# Patient Record
Sex: Male | Born: 1950
Health system: Southern US, Community
[De-identification: ages and names within clinical notes are randomized; demographics above are authoritative.]

## PROBLEM LIST (undated history)

## (undated) DIAGNOSIS — G309 Alzheimer's disease, unspecified: Secondary | ICD-10-CM

## (undated) DIAGNOSIS — R05 Cough: Secondary | ICD-10-CM

## (undated) DIAGNOSIS — I10 Essential (primary) hypertension: Secondary | ICD-10-CM

## (undated) DIAGNOSIS — R053 Chronic cough: Secondary | ICD-10-CM

## (undated) DIAGNOSIS — R413 Other amnesia: Secondary | ICD-10-CM

## (undated) DIAGNOSIS — R7303 Prediabetes: Secondary | ICD-10-CM

## (undated) DIAGNOSIS — F0280 Dementia in other diseases classified elsewhere without behavioral disturbance: Secondary | ICD-10-CM

## (undated) HISTORY — DX: Chronic cough: R05.3

## (undated) HISTORY — DX: Dementia in other diseases classified elsewhere without behavioral disturbance: F02.80

## (undated) HISTORY — DX: Prediabetes: R73.03

## (undated) HISTORY — DX: Other amnesia: R41.3

## (undated) HISTORY — DX: Cough: R05

## (undated) HISTORY — DX: Essential (primary) hypertension: I10

## (undated) HISTORY — DX: Alzheimer's disease, unspecified: G30.9

---

## 2014-05-23 HISTORY — PX: BUNIONECTOMY: SHX129

## 2014-08-28 ENCOUNTER — Ambulatory Visit (INDEPENDENT_AMBULATORY_CARE_PROVIDER_SITE_OTHER): Payer: No Typology Code available for payment source | Admitting: Podiatry

## 2014-08-28 ENCOUNTER — Ambulatory Visit (INDEPENDENT_AMBULATORY_CARE_PROVIDER_SITE_OTHER): Payer: No Typology Code available for payment source

## 2014-08-28 DIAGNOSIS — M79671 Pain in right foot: Secondary | ICD-10-CM

## 2014-08-28 DIAGNOSIS — M2011 Hallux valgus (acquired), right foot: Secondary | ICD-10-CM

## 2014-08-28 DIAGNOSIS — M79672 Pain in left foot: Secondary | ICD-10-CM

## 2014-08-28 DIAGNOSIS — M21611 Bunion of right foot: Secondary | ICD-10-CM

## 2014-08-28 NOTE — Progress Notes (Signed)
   Subjective:    Patient ID: Edwin Stewart, male    DOB: [birth year], 64 y.o.   MRN: [id number]  HPI Pt presents with bilateral bunions, worse on right foot and painful   Review of Systems  Respiratory: Positive for shortness of breath.   Neurological: Positive for dizziness.  All other systems reviewed and are negative.      Objective:   Physical Exam        Assessment & Plan:

## 2014-08-28 NOTE — Patient Instructions (Signed)
Pre-Operative Instructions  Congratulations, you have decided to take an important step to improving your quality of life.  You can be assured that the doctors of [facility] will be with you every step of the way.  1. Plan to be at the surgery center/hospital at least 1 (one) hour prior to your scheduled time unless otherwise directed by the surgical center/hospital staff.  You must have a responsible adult accompany you, remain during the surgery and drive you home.  Make sure you have directions to the surgical center/hospital and know how to get there on time. 2. For hospital based surgery you will need to obtain a history and physical form from your family physician within 1 month prior to the date of surgery- we will give you a form for you primary physician.  3. We make every effort to accommodate the date you request for surgery.  There are however, times where surgery dates or times have to be moved.  We will contact you as soon as possible if a change in schedule is required.   4. No Aspirin/Ibuprofen for one week before surgery.  If you are on aspirin, any non-steroidal anti-inflammatory medications (Mobic, Aleve, Ibuprofen) you should stop taking it 7 days prior to your surgery.  You make take Tylenol  For pain prior to surgery.  5. Medications- If you are taking daily heart and blood pressure medications, seizure, reflux, allergy, asthma, anxiety, pain or diabetes medications, make sure the surgery center/hospital is aware before the day of surgery so they may notify you which medications to take or avoid the day of surgery. 6. No food or drink after midnight the night before surgery unless directed otherwise by surgical center/hospital staff. 7. No alcoholic beverages 24 hours prior to surgery.  No smoking 24 hours prior to or 24 hours after surgery. 8. Wear loose pants or shorts- loose enough to fit over bandages, boots, and casts. 9. No slip on shoes, sneakers are best. 10. Bring  your boot with you to the surgery center/hospital.  Also bring crutches or a walker if your physician has prescribed it for you.  If you do not have this equipment, it will be provided for you after surgery. 11. If you have not been contracted by the surgery center/hospital by the day before your surgery, call to confirm the date and time of your surgery. 12. Leave-time from work may vary depending on the type of surgery you have.  Appropriate arrangements should be made prior to surgery with your employer. 13. Prescriptions will be provided immediately following surgery by your doctor.  Have these filled as soon as possible after surgery and take the medication as directed. 14. Remove nail polish on the operative foot. 15. Wash the night before surgery.  The night before surgery wash the foot and leg well with the antibacterial soap provided and water paying special attention to beneath the toenails and in between the toes.  Rinse thoroughly with water and dry well with a towel.  Perform this wash unless told not to do so by your physician.  Enclosed: 1 Ice pack (please put in freezer the night before surgery)   1 Hibiclens skin cleaner   Pre-op Instructions  If you have any questions regarding the instructions, do not hesitate to call our office.  [place]: [address] [place], [place] [address] [phone]  [place]: [address] [place], [place] [address] [phone]  [place]: [address]  [place],  [address] [phone]  Dr. Richard   Tuchman DPM, Dr. Norman Regal DPM Dr. Richard Sikora DPM, Dr. M. Todd Hyatt DPM, Dr. Kathryn Egerton DPM 

## 2014-08-29 NOTE — Progress Notes (Signed)
Subjective:     Patient ID: Edwin Stewart, male   DOB: [birth year], 64 y.o.   MRN: [id number]  HPI patient presents with painful bunion deformity right over left foot that red and makes it difficult to wear shoe gear. Patient states she's tried shoe gear modifications and soaks without relief   Review of Systems  All other systems reviewed and are negative.      Objective:   Physical Exam  Constitutional: She is oriented to person, place, and time.  Cardiovascular: Intact distal pulses.   Musculoskeletal: Normal range of motion.  Neurological: She is oriented to person, place, and time.  Skin: Skin is warm.  Nursing note and vitals reviewed.  neurovascular status found to be intact with muscle strength adequate and range of motion subtalar midtarsal joint within normal limits. Patient's noted to have redness and pain around the first metatarsal head right with mild deviation of the hallux against second toe and is noted to have moderate depression of the arch bilateral     Assessment:     Structural HAV deformity right over left foot with pain and deformity and moderate flatfoot deformity which is a part of the symptomatically process    Plan:     Reviewed condition at great length and I've recommended correction of deformity secondary to pain and pressure and inability to wear shoe gear with any degree of comfort. Patient wants to get it fixed and needs to get it fixed as soon as possible due to his son getting married in July and at this time I did allow him to read a consent form for Austin-type osteotomy right with pin fixation. I explained alternative treatments and I explained complications and reviewed all possible complications that can occur. Patient wants the surgery understanding total recovery. Take approximate 6 months to one year and at this time signs consent form. He is dispensed air fracture walker with all instructions on usage and will be seen back for surgical  intervention and is encouraged to call with any questions prior to surgery in the next several weeks

## 2014-09-09 ENCOUNTER — Encounter: Payer: Self-pay | Admitting: Podiatry

## 2014-09-09 DIAGNOSIS — M2011 Hallux valgus (acquired), right foot: Secondary | ICD-10-CM | POA: Diagnosis not present

## 2014-09-11 NOTE — Progress Notes (Signed)
DOS 09/09/2014 Right austin bunionectomy

## 2014-09-15 ENCOUNTER — Ambulatory Visit (INDEPENDENT_AMBULATORY_CARE_PROVIDER_SITE_OTHER): Payer: No Typology Code available for payment source

## 2014-09-15 ENCOUNTER — Ambulatory Visit (INDEPENDENT_AMBULATORY_CARE_PROVIDER_SITE_OTHER): Payer: No Typology Code available for payment source | Admitting: Podiatry

## 2014-09-15 DIAGNOSIS — Z9889 Other specified postprocedural states: Secondary | ICD-10-CM

## 2014-09-15 DIAGNOSIS — M2011 Hallux valgus (acquired), right foot: Secondary | ICD-10-CM

## 2014-09-15 DIAGNOSIS — M21611 Bunion of right foot: Secondary | ICD-10-CM

## 2014-09-16 NOTE — Progress Notes (Signed)
Subjective:     Patient ID: Edwin Stewart, male   DOB: [birth year], 64 y.o.   MRN: [id number]  HPI patient states I'm doing real well with my foot and having good motion and minimal discomfort or swelling   Review of Systems     Objective:   Physical Exam Neurovascular status intact muscle strength adequate with good first MPJ motion 25 dorsi flexion 20 plantar flexion with no pain no crepitus noted and wound edges that are well coapted    Assessment:     Doing well 1 week after foot surgery right first MPJ    Plan:     X-rays reviewed with patient and BioSkin type above ankle brace dispensed with attachment to plantarflexed the hallux for physical therapy. Patient will begin exercises and gradually migrated surgical shoe and will continue elevation and compression

## 2014-09-29 ENCOUNTER — Ambulatory Visit: Payer: No Typology Code available for payment source | Admitting: *Deleted

## 2014-09-29 VITALS — BP 178/100 | HR 83 | Resp 14

## 2014-09-29 DIAGNOSIS — Z9889 Other specified postprocedural states: Secondary | ICD-10-CM

## 2014-09-29 NOTE — Progress Notes (Signed)
   Subjective:    Patient ID: Edwin Stewart, male    DOB: 06-Nov-1950, 64 y.o.   MRN: [id number]  HPI Comments: DOS 09/09/2014 right austin bunionectomy.  Pt states he has a bunion brace that he uses and I encouraged pt to continue and return in 2 weeks for follow up.     Review of Systems     Objective:   Physical Exam        Assessment & Plan:

## 2014-10-27 ENCOUNTER — Ambulatory Visit (INDEPENDENT_AMBULATORY_CARE_PROVIDER_SITE_OTHER): Payer: No Typology Code available for payment source

## 2014-10-27 ENCOUNTER — Ambulatory Visit (INDEPENDENT_AMBULATORY_CARE_PROVIDER_SITE_OTHER): Payer: No Typology Code available for payment source | Admitting: Podiatry

## 2014-10-27 VITALS — BP 135/73 | HR 77 | Resp 15

## 2014-10-27 DIAGNOSIS — Z9889 Other specified postprocedural states: Secondary | ICD-10-CM

## 2014-10-27 DIAGNOSIS — M2011 Hallux valgus (acquired), right foot: Secondary | ICD-10-CM

## 2014-10-27 DIAGNOSIS — M21611 Bunion of right foot: Secondary | ICD-10-CM

## 2014-10-27 NOTE — Progress Notes (Signed)
Subjective:     Patient ID: Edwin Stewart, male   DOB: 27-Feb-1951, 64 y.o.   MRN: [id number]  HPI patient states I been doing very well and walking normally with still some swelling in my foot if him on it too long   Review of Systems     Objective:   Physical Exam  neurovascular status intact muscle strength adequate with well-healing surgical site right first metatarsal with wound edges well coapted and mild to moderate edema noted    Assessment:      doing well post Austin osteotomy right    Plan:      x-rays reviewed and at this time explained is been slight movement but it is stable and should not get worse and patient will be seen back as needed and at this time was allowed to resume normal activity but still avoid any type of intense activities on his right foot

## 2014-12-01 ENCOUNTER — Ambulatory Visit (INDEPENDENT_AMBULATORY_CARE_PROVIDER_SITE_OTHER): Payer: No Typology Code available for payment source | Admitting: Podiatry

## 2014-12-01 ENCOUNTER — Ambulatory Visit (INDEPENDENT_AMBULATORY_CARE_PROVIDER_SITE_OTHER): Payer: No Typology Code available for payment source

## 2014-12-01 DIAGNOSIS — Z9889 Other specified postprocedural states: Secondary | ICD-10-CM

## 2014-12-01 DIAGNOSIS — L600 Ingrowing nail: Secondary | ICD-10-CM

## 2014-12-02 NOTE — Progress Notes (Signed)
Subjective:     Patient ID: Edwin Stewart, male   DOB: February 14, 1951, 64 y.o.   MRN: [id number]  HPI patient states I'm doing well with my surgery right and I do have awful toenails 1 through 5 of both feet that I cannot cut and they tend to get infected and bleed when I try to cut them   Review of Systems     Objective:   Physical Exam Neurovascular status intact muscle strength adequate with well-healed surgical site first metatarsal right with damage nailbeds 2 through 5 bilateral with the hallux that had been removed in the past but I did note that there is a spicule on the right hallux. Patient is tired of trying to cut these and they are very incurvated when evaluated    Assessment:     Doing well post Austin osteotomy right with damage nailbeds 1 through 5 right 2 through 5 left    Plan:     Reviewed x-rays of right foot and both conditions. Patient wants nail removal and we will do it after his son is married and I discussed permanent nail procedures. Patient at this time is allowed to return to normal activities far as the bunion goes and any remaining swelling will gradually improve

## 2015-08-03 ENCOUNTER — Other Ambulatory Visit: Payer: Self-pay | Admitting: Allergy and Immunology

## 2015-08-03 ENCOUNTER — Ambulatory Visit
Admission: RE | Admit: 2015-08-03 | Discharge: 2015-08-03 | Disposition: A | Discharge status: Home / Self Care | Payer: No Typology Code available for payment source | Source: Ambulatory Visit | Attending: Allergy and Immunology | Admitting: Allergy and Immunology

## 2015-08-03 DIAGNOSIS — R05 Cough: Secondary | ICD-10-CM

## 2015-08-03 DIAGNOSIS — R059 Cough, unspecified: Secondary | ICD-10-CM

## 2015-08-04 ENCOUNTER — Other Ambulatory Visit (HOSPITAL_COMMUNITY): Payer: Self-pay

## 2015-08-10 ENCOUNTER — Telehealth (HOSPITAL_COMMUNITY): Payer: Self-pay | Admitting: *Deleted

## 2015-08-10 NOTE — Telephone Encounter (Signed)
Left message on voicemail in reference to upcoming appointment scheduled for 08/11/15. Phone number given for a call back so details instructions can be given. Kourosh Jablonsky W   

## 2015-08-11 ENCOUNTER — Other Ambulatory Visit (HOSPITAL_COMMUNITY): Payer: Self-pay

## 2015-08-11 ENCOUNTER — Other Ambulatory Visit (HOSPITAL_COMMUNITY): Payer: Self-pay | Admitting: Nurse Practitioner

## 2015-08-11 DIAGNOSIS — R42 Dizziness and giddiness: Secondary | ICD-10-CM

## 2015-08-25 ENCOUNTER — Ambulatory Visit (INDEPENDENT_AMBULATORY_CARE_PROVIDER_SITE_OTHER): Payer: No Typology Code available for payment source | Admitting: Internal Medicine

## 2015-08-25 ENCOUNTER — Other Ambulatory Visit (INDEPENDENT_AMBULATORY_CARE_PROVIDER_SITE_OTHER): Payer: No Typology Code available for payment source

## 2015-08-25 ENCOUNTER — Encounter: Payer: Self-pay | Admitting: Internal Medicine

## 2015-08-25 VITALS — BP 158/80 | HR 78 | Ht 71.0 in | Wt 230.2 lb

## 2015-08-25 DIAGNOSIS — R053 Chronic cough: Secondary | ICD-10-CM

## 2015-08-25 DIAGNOSIS — R599 Enlarged lymph nodes, unspecified: Secondary | ICD-10-CM

## 2015-08-25 DIAGNOSIS — R05 Cough: Secondary | ICD-10-CM

## 2015-08-25 DIAGNOSIS — R0689 Other abnormalities of breathing: Secondary | ICD-10-CM | POA: Diagnosis not present

## 2015-08-25 DIAGNOSIS — R0600 Dyspnea, unspecified: Secondary | ICD-10-CM

## 2015-08-25 DIAGNOSIS — R590 Localized enlarged lymph nodes: Secondary | ICD-10-CM

## 2015-08-25 LAB — BASIC METABOLIC PANEL
BUN: 16 mg/dL (ref 6–23)
CO2: 25 mEq/L (ref 19–32)
CREATININE: 0.94 mg/dL (ref 0.40–1.20)
Calcium: 10.2 mg/dL (ref 8.4–10.5)
Chloride: 102 mEq/L (ref 96–112)
GFR: 63.66 mL/min (ref >60.00)
Glucose, Bld: 133 mg/dL — ABNORMAL HIGH (ref 70–99)
POTASSIUM: 3.8 meq/L (ref 3.5–5.1)
Sodium: 137 mEq/L (ref 135–145)

## 2015-08-25 LAB — NITRIC OXIDE: NITRIC OXIDE: 18

## 2015-08-25 NOTE — Patient Instructions (Signed)
ICD-9-CM ICD-10-CM   1. Dyspnea and respiratory abnormality 786.09 R06.00     R06.89   2. Chronic cough 786.2 R05   3. Hilar adenopathy 785.6 R59.9    Symptoms remain unexplained  Plan  - Do CT chest with contrast in the next few weeks - Do full pulmonary function test in the next few weeks - Complete cardiac stress test scheduled by other physician  Follow-up - Return to see one of my nurse practitioner's after completing all of the above but do so in the next few weeks

## 2015-08-25 NOTE — Addendum Note (Signed)
Addended by: Collier Salina on: 08/25/2015 12:15 PM   Modules accepted: Orders

## 2015-08-25 NOTE — Progress Notes (Addendum)
Subjective:    Patient ID: Edwin Stewart, male    DOB: 1950-08-20, 65 y.o.   MRN: [id number]  PCP [facility]   HPI  IOV 08/25/2015  Chief Complaint  Patient presents with  . Pulmonary Consult    Pt referred by Dr. Fredderick Phenix for chronic cough. Pt states cough is occassional productive with white mucus x 3 months. Pt states after he has coughing spells then will develop gas. Pt c/o SOB with rest and activity.     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 Has been living and [place] retired for the last 11 years. He is accompanied by his wife Edwin Stewart. He is referred by Dr. Fredderick Phenix of allergy. At baseline. Patient has mild spring pollen allergies. At baseline he is also on ACE inhibitor's for many many years. Then starting around December 2016 started noticing insidious onset of cough and according to the wife even dyspnea on exertion. The wife provides more accurate history. Dyspnea is the bigger complaint. Dyspnea is noticed with exertion and relieved by rest. It is consistent repeatable. Dyspnea is noticed walking a flight of stairs or for example walking for the steps at a soccer game. A month ago was started on Symbicort and this has helped. I do not think complaints are related to lisinopril intake. In addition there is a cough also going on for the last 2-4 months. It is also improved with Symbicort. Is associated with ACE inhibitor intake but they do not think this is the causative agent. Cough is present nocturnally. It is associated with burping. Symptoms are moderate severity.He and his wife feel that ACE inhibitor is not related to the symptoms  Exhaled nitric oxide today in the office - 18 ppb and normal (on symbicort). Has fami hx of asthma  Walking desaturation test 185) 3 laps in the office today on room air- did not desaturate   Has long standing hx of abnormal cxr nos  CXR 08/03/2015 - personally visualized and  agree IMPRESSION: Asymmetric prominence in the right infrahilar region, cannot exclude adenopathy or parahilar pulmonary nodule. Recommend correlation with chest CT with IV contrast.  Lungs appear hyperinflated, suggesting obstructive lung disease.   Electronically Signed  By: Ilona Sorrel M.D.  On: 08/03/2015 17:32   Dr Lorenza [place] Reflux Symptom Index (> 13-15 suggestive of LPR cough) 0 -> 5  =  none ->severe problem 08/25/2015   Hoarseness of problem with voice 3  Clearing  Of Throat 5  Excess throat mucus or feeling of post nasal drip 2  Difficulty swallowing food, liquid or tablets 3  Cough after eating or lying down 4  Breathing difficulties or choking episodes 4  Troublesome or annoying cough 4  Sensation of something sticking in throat or lump in throat 4  Heartburn, chest pain, indigestion, or stomach acid coming up 3  TOTAL 32       has a past medical history of Chronic cough; Hypertension; and Pre-diabetes.   reports that she has never smoked. She has never used smokeless tobacco.  Past Surgical History  Procedure Laterality Date  . Bunionectomy  2016    No Known Allergies  Immunization History  Administered Date(s) Administered  . Influenza,inj,Quad PF,36+ Mos 02/21/2015    Family History  Problem Relation Age of Onset  . Asthma Maternal Uncle   . Emphysema Maternal Uncle   . Diabetes Mellitus II Father   . Renal Disease Brother   . Diabetes Mellitus II Brother  Current outpatient prescriptions:  .  albuterol (PROAIR HFA) 108 (90 Base) MCG/ACT inhaler, Inhale 1-2 puffs into the lungs every 6 (six) hours as needed for wheezing or shortness of breath., Disp: , Rfl:  .  budesonide-formoterol (SYMBICORT) 80-4.5 MCG/ACT inhaler, Inhale 2 puffs into the lungs 2 (two) times daily., Disp: , Rfl:  .  levothyroxine (SYNTHROID, LEVOTHROID) 200 MCG tablet, Take 200 mcg by mouth daily before breakfast., Disp: , Rfl:  .  lisinopril-hydrochlorothiazide  (PRINZIDE,ZESTORETIC) 20-12.5 MG per tablet, Take 1 tablet by mouth daily., Disp: , Rfl:     Review of Systems  Constitutional: Negative for fever and unexpected weight change.  HENT: Negative for congestion, dental problem, ear pain, nosebleeds, postnasal drip, rhinorrhea, sinus pressure, sneezing, sore throat and trouble swallowing.   Eyes: Negative for redness and itching.  Respiratory: Positive for cough. Negative for chest tightness, shortness of breath and wheezing.   Cardiovascular: Negative for palpitations and leg swelling.  Gastrointestinal: Negative for nausea and vomiting.  Genitourinary: Negative for dysuria.  Musculoskeletal: Negative for joint swelling.  Skin: Negative for rash.  Neurological: Negative for headaches.  Hematological: Does not bruise/bleed easily.  Psychiatric/Behavioral: Negative for dysphoric mood. The patient is not nervous/anxious.        Objective:   Physical Exam  Constitutional: She is oriented to person, place, and time. She appears well-developed and well-nourished. No distress.  HENT:  Head: Normocephalic and atraumatic.  Right Ear: External ear normal.  Left Ear: External ear normal.  Mouth/Throat: Oropharynx is clear and moist. No oropharyngeal exudate.  Eyes: Conjunctivae and EOM are normal. Pupils are equal, round, and reactive to light. Right eye exhibits no discharge. Left eye exhibits no discharge. No scleral icterus.  Neck: Normal range of motion. Neck supple. No JVD present. No tracheal deviation present. No thyromegaly present.  Cardiovascular: Normal rate, regular rhythm, normal heart sounds and intact distal pulses.  Exam reveals no gallop and no friction rub.   No murmur heard. Pulmonary/Chest: Effort normal and breath sounds normal. No respiratory distress. She has no wheezes. She has no rales. She exhibits no tenderness.  Abdominal: Soft. Bowel sounds are normal. She exhibits no distension and no mass. There is no tenderness.  There is no rebound and no guarding.  Significant visceral obesity +  Musculoskeletal: Normal range of motion. She exhibits no edema or tenderness.  Lymphadenopathy:    She has no cervical adenopathy.  Neurological: She is alert and oriented to person, place, and time. She has normal reflexes. No cranial nerve deficit. She exhibits normal muscle tone. Coordination normal.  Skin: Skin is warm and dry. No rash noted. She is not diaphoretic. No erythema. No pallor.  Psychiatric: She has a normal mood and affect. Her behavior is normal. Judgment and thought content normal.  Vitals reviewed.   Filed Vitals:   08/25/15 1114  BP: 158/80  Stewart: 78  Height: 5\' 11"  (1.803 m)  Weight: 230 lb 3.2 oz (104.418 kg)  SpO2: 98%         Assessment & Plan:     ICD-9-CM ICD-10-CM   1. Dyspnea and respiratory abnormality 786.09 R06.00     R06.89   2. Chronic cough 786.2 R05   3. Hilar adenopathy 785.6 R59.9     Symptoms remain unexplained. Need to rule out lung mass or hilar adenopathy to explain the symptoms.  Plan  - Do CT chest with contrast in the next few weeks - Do full pulmonary function test in the next few  weeks - Complete cardiac stress test scheduled by other physician  Follow-up - Return to see one of my nurse practitioner's after completing all of the above but do so in the next few weeks - If all of the above tests are negative we'll need pulmonary stress test on the bike     Dr. Brand Males, M.D., [facility].P Pulmonary and Critical Care Medicine Staff Physician [facility] Pager: [phone], If no answer or between  15:00h - 7:00h: call [phone]  08/25/2015 12:11 PM

## 2015-08-27 ENCOUNTER — Telehealth (HOSPITAL_COMMUNITY): Payer: Self-pay | Admitting: *Deleted

## 2015-08-27 NOTE — Telephone Encounter (Signed)
Patient given detailed instructions per Stress Test Requisition Sheet for test on 08/31/15 at 7:30.Patient Notified to arrive 30 minutes early, and that it is imperative to arrive on time for appointment to keep from having the test rescheduled.  Patient verbalized understanding. Edwin Stewart

## 2015-08-31 ENCOUNTER — Ambulatory Visit (HOSPITAL_COMMUNITY): Payer: No Typology Code available for payment source | Attending: Cardiovascular Disease

## 2015-08-31 ENCOUNTER — Ambulatory Visit (HOSPITAL_BASED_OUTPATIENT_CLINIC_OR_DEPARTMENT_OTHER): Payer: No Typology Code available for payment source

## 2015-08-31 ENCOUNTER — Ambulatory Visit (INDEPENDENT_AMBULATORY_CARE_PROVIDER_SITE_OTHER)
Admission: RE | Admit: 2015-08-31 | Discharge: 2015-08-31 | Disposition: A | Discharge status: Home / Self Care | Payer: No Typology Code available for payment source | Source: Ambulatory Visit | Attending: Internal Medicine | Admitting: Internal Medicine

## 2015-08-31 DIAGNOSIS — R05 Cough: Secondary | ICD-10-CM

## 2015-08-31 DIAGNOSIS — Z6832 Body mass index (BMI) 32.0-32.9, adult: Secondary | ICD-10-CM | POA: Diagnosis not present

## 2015-08-31 DIAGNOSIS — R42 Dizziness and giddiness: Secondary | ICD-10-CM | POA: Diagnosis not present

## 2015-08-31 DIAGNOSIS — R599 Enlarged lymph nodes, unspecified: Secondary | ICD-10-CM

## 2015-08-31 DIAGNOSIS — R590 Localized enlarged lymph nodes: Secondary | ICD-10-CM

## 2015-08-31 DIAGNOSIS — R0600 Dyspnea, unspecified: Secondary | ICD-10-CM

## 2015-08-31 DIAGNOSIS — R053 Chronic cough: Secondary | ICD-10-CM

## 2015-08-31 DIAGNOSIS — I491 Atrial premature depolarization: Secondary | ICD-10-CM | POA: Diagnosis not present

## 2015-08-31 DIAGNOSIS — E669 Obesity, unspecified: Secondary | ICD-10-CM | POA: Diagnosis not present

## 2015-08-31 DIAGNOSIS — I10 Essential (primary) hypertension: Secondary | ICD-10-CM | POA: Insufficient documentation

## 2015-08-31 DIAGNOSIS — R000 Tachycardia, unspecified: Secondary | ICD-10-CM | POA: Insufficient documentation

## 2015-08-31 DIAGNOSIS — R0689 Other abnormalities of breathing: Secondary | ICD-10-CM

## 2015-08-31 MED ORDER — IOPAMIDOL (ISOVUE-300) INJECTION 61%
100.0000 mL | Freq: Once | INTRAVENOUS | Status: AC | PRN
  Administered 2015-08-31: 100 mL via INTRAVENOUS

## 2015-09-03 ENCOUNTER — Telehealth: Payer: Self-pay | Admitting: Internal Medicine

## 2015-09-03 NOTE — Telephone Encounter (Signed)
Pt wifer returning call.Edwin Stewart

## 2015-09-03 NOTE — Telephone Encounter (Signed)
LVM for Edwin Stewart to return call.

## 2015-09-03 NOTE — Telephone Encounter (Signed)
Called and spoke with pt. She states she was calling to review pt's CT results. I explained to her that MR is out of the office this week and that I can send a message to DOD but she states she has an ov with SB on 09/07/15 and will discuss the results at that time. She voiced understanding and had no further questions. Nothing further needed at this time.

## 2015-09-06 ENCOUNTER — Telehealth: Payer: Self-pay | Admitting: Internal Medicine

## 2015-09-06 NOTE — Telephone Encounter (Signed)
Rosita Kea are seeing him for chronic cough eval followup tomorrow. Lot of things on his CT and I will go ove rthis with you now. In blue is my respnse. He will come with his wife who is a better historian   Thanks  MR   CT chest   IMPRESSION: 1. Faint patchy ground-glass centrilobular nodularity throughout both lungs, upper lung predominant. The patient is reportedly not a smoker. These findings may indicate subacute hypersensitivity pneumonitis. Consider follow-up high-resolution chest CT study in 12 Months. -> agree with consideration of hypersensitivity pneumonitis -> pleas ask them about mold, mildew, hot tub exposure. REcommend fu HRCT in 6 months as for nodule. This can explain his cough. He has been on ACE for year but I would recommend ARB just to take the edge off. Also, please do ANA, DS-DNA, RF, CCP, SCL-70, SSA, SSB and Hypersensitivuty pneumonitis panel   2. Two solid pulmonary nodules in the lower lobes, largest 6 mm in the left lower lobe. Non-contrast chest CT at 3-6 months is recommended. If the nodules are stable at time of repeat CT, then future CT at 18-24 months (from today's scan) is considered optional for low-risk patients, but is recommended for high-risk patients. This recommendation follows the consensus statement: Guidelines for Management of Incidental Pulmonary Nodules Detected on CT Images: From the Fleischner Society 2017; published online before print (10.1148/radiol.SG:5268862). -> in non smoker low risk for lung cancer -> do repeat HRCT in 6 months  3. Nonspecific mild subcarinal lymphadenopathy, probably reactive, which can be reassessed on the follow-up chest CT performed for the pulmonary nodules. No hilar lymphadenopathy. -> this was a concern for him. REassuring  4. Aortic coarctation with focal 50- 60% stenosis of the thoracic aorta at the junction of the aortic isthmus and descending thoracic aorta, probably congenital, of uncertain  clinical/hemodynamic Significance. -> refer CVTS  5. Two-vessel coronary atherosclerosis.-> he will need to tell you if outside stress test was fine   Electronically Signed  By: Ilona Sorrel M.D.  On: 08/31/2015 11:22

## 2015-09-07 ENCOUNTER — Ambulatory Visit (HOSPITAL_COMMUNITY)
Admission: RE | Admit: 2015-09-07 | Discharge: 2015-09-07 | Disposition: A | Discharge status: Home / Self Care | Payer: No Typology Code available for payment source | Source: Ambulatory Visit | Attending: Internal Medicine | Admitting: Internal Medicine

## 2015-09-07 ENCOUNTER — Other Ambulatory Visit: Payer: No Typology Code available for payment source

## 2015-09-07 ENCOUNTER — Encounter: Payer: Self-pay | Admitting: Acute Care

## 2015-09-07 ENCOUNTER — Ambulatory Visit (INDEPENDENT_AMBULATORY_CARE_PROVIDER_SITE_OTHER): Payer: No Typology Code available for payment source | Admitting: Acute Care

## 2015-09-07 VITALS — BP 142/72 | HR 75 | Temp 98.2°F | Ht 71.0 in | Wt 225.6 lb

## 2015-09-07 DIAGNOSIS — R053 Chronic cough: Secondary | ICD-10-CM

## 2015-09-07 DIAGNOSIS — R05 Cough: Secondary | ICD-10-CM

## 2015-09-07 DIAGNOSIS — R0689 Other abnormalities of breathing: Secondary | ICD-10-CM | POA: Diagnosis not present

## 2015-09-07 DIAGNOSIS — R0600 Dyspnea, unspecified: Secondary | ICD-10-CM | POA: Insufficient documentation

## 2015-09-07 DIAGNOSIS — I10 Essential (primary) hypertension: Secondary | ICD-10-CM

## 2015-09-07 DIAGNOSIS — R599 Enlarged lymph nodes, unspecified: Secondary | ICD-10-CM

## 2015-09-07 DIAGNOSIS — R590 Localized enlarged lymph nodes: Secondary | ICD-10-CM

## 2015-09-07 DIAGNOSIS — I2510 Atherosclerotic heart disease of native coronary artery without angina pectoris: Secondary | ICD-10-CM | POA: Insufficient documentation

## 2015-09-07 DIAGNOSIS — Q251 Coarctation of aorta: Secondary | ICD-10-CM | POA: Insufficient documentation

## 2015-09-07 DIAGNOSIS — R918 Other nonspecific abnormal finding of lung field: Secondary | ICD-10-CM | POA: Insufficient documentation

## 2015-09-07 LAB — PULMONARY FUNCTION TEST
DL/VA % pred: 101 %
DL/VA: 4.73 ml/min/mmHg/L
DLCO unc % pred: 71 %
DLCO unc: 24.13 ml/min/mmHg
FEF 25-75 POST: 2.45 L/s
FEF 25-75 Pre: 1.87 L/sec
FEF2575-%Change-Post: 30 %
FEF2575-%PRED-PRE: 65 %
FEF2575-%Pred-Post: 85 %
FEV1-%Change-Post: 8 %
FEV1-%PRED-POST: 71 %
FEV1-%Pred-Pre: 66 %
FEV1-POST: 2.57 L
FEV1-PRE: 2.37 L
FEV1FVC-%Change-Post: 0 %
FEV1FVC-%PRED-PRE: 99 %
FEV6-%Change-Post: 7 %
FEV6-%PRED-POST: 74 %
FEV6-%Pred-Pre: 69 %
FEV6-POST: 3.38 L
FEV6-Pre: 3.16 L
FEV6FVC-%CHANGE-POST: 0 %
FEV6FVC-%PRED-POST: 104 %
FEV6FVC-%Pred-Pre: 104 %
FVC-%CHANGE-POST: 7 %
FVC-%PRED-POST: 71 %
FVC-%Pred-Pre: 66 %
FVC-PRE: 3.17 L
FVC-Post: 3.42 L
PRE FEV1/FVC RATIO: 75 %
Post FEV1/FVC ratio: 75 %
Post FEV6/FVC ratio: 99 %
Pre FEV6/FVC Ratio: 100 %
RV % pred: 132 %
RV: 3.16 L
TLC % PRED: 90 %
TLC: 6.53 L

## 2015-09-07 LAB — RHEUMATOID FACTOR: Rhuematoid fact SerPl-aCnc: <10 IU/mL (ref <=14)

## 2015-09-07 MED ORDER — ALBUTEROL SULFATE (2.5 MG/3ML) 0.083% IN NEBU
2.5000 mg | INHALATION_SOLUTION | Freq: Once | RESPIRATORY_TRACT | Status: AC
  Administered 2015-09-07: 2.5 mg via RESPIRATORY_TRACT

## 2015-09-07 MED ORDER — VALSARTAN-HYDROCHLOROTHIAZIDE 160-12.5 MG PO TABS: 1.0000 | ORAL_TABLET | Freq: Every day | ORAL | Status: DC

## 2015-09-07 NOTE — Assessment & Plan Note (Signed)
Non- specific mild subcarinal lymphadenopathy, probably reactive. Plan: Assess on follow up HCRT (02/2016)

## 2015-09-07 NOTE — Progress Notes (Signed)
Subjective:    Patient ID: Edwin Stewart, male    DOB: June 15, 1950, 65 y.o.   MRN: [id number]  HPI 64 year old male. 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 Has been living and [place] retired for the last 11 years. He is accompanied by his wife Juliann Pulse. He is referred by Dr. Fredderick Phenix of allergy. At baseline. Patient has mild spring pollen allergies. At baseline he is also on ACE inhibitor's for many many years. Then starting around December 2016 started noticing insidious onset of cough and according to the wife even dyspnea on exertion. The wife provides more accurate history. Dyspnea is the bigger complaint. Dyspnea is noticed with exertion and relieved by rest. It is consistent repeatable. Dyspnea is noticed walking a flight of stairs or for example walking for the steps at a soccer game. A month ago was started on Symbicort and this has helped. I do not think complaints are related to lisinopril intake. In addition there is a cough also going on for the last 2-4 months. It is also improved with Symbicort. Is associated with ACE inhibitor intake but they do not think this is the causative agent. Cough is present nocturnally. It is associated with burping. Symptoms are moderate severity.He and his wife feel that ACE inhibitor is not related to the symptoms  Exhaled nitric oxide 08/25/2015 in the office - 18 ppb and normal (on symbicort). Has family  hx of asthma  Walking desaturation test 185) 3 laps in the office today on room air- did not desaturate  Tests/  Procedures:  08/28/2015:CT Chest w/ Contrast   IMPRESSION: 1. Faint patchy ground-glass centrilobular nodularity throughout both lungs, upper lung predominant. The patient is reportedly not a smoker. These findings may indicate subacute hypersensitivity pneumonitis. Consider follow-up high-resolution chest CT study in 12 months. 2. Two solid pulmonary nodules in the lower lobes, largest 6 mm in the left lower  lobe. Non-contrast chest CT at 3-6 months is recommended. If the nodules are stable at time of repeat CT, then future CT at 18-24 months (from today's scan) is considered optional for low-risk patients, but is recommended for high-risk patients. This recommendation follows the consensus statement: Guidelines for Management of Incidental Pulmonary Nodules Detected on CT Images: From the Fleischner Society 2017; published online before print (10.1148/radiol.IJ:2314499). 3. Nonspecific mild subcarinal lymphadenopathy, probably reactive, which can be reassessed on the follow-up chest CT performed for the pulmonary nodules. No hilar lymphadenopathy. 4. Aortic coarctation with focal 50- 60% stenosis of the thoracic aorta at the junction of the aortic isthmus and descending thoracic aorta, probably congenital, of uncertain clinical/hemodynamic significance. 5. Two-vessel coronary atherosclerosis.  08/31/2015:Echo Stress Test WO Image Agent Study Conclusions  - Baseline ECG: Normal sinus rhythm. - Stress ECG conclusions: Sinus tachycardia with PAC&'s. No ischemic  EKG changes. - Baseline: LVEF 55-60%, normal wall motion and thickening. - Peak stress: LVEF 70-75%, hyperdynamic LV function with normal  wall motion and thickening. - Recovery: LVEF 55-60%, normal wall motion and thickening.  Impressions:  - No electrocardiographic or echocardiographic evidence for  exercise induced ischemia. Fair exercise tolerance with a  hypertensive response to exercise.   09/07/2015: PFT's: Moderate Obstructive Disease Mild diffusion deficit  09/07/2015: Follow up OV:  Pt. Returns to the office today for discussion of diagnostics ordered by Dr. Chase Caller: We discussed the results of his CT scan: which suggested possibility of hypersensitivity pneumonitis. Pt. States he feels there is no mold/mildew/ or hot tub use in the home he has lived  in for the last 12 years. 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  He agrees to the Hypersensitivity pneumonitis panel, and to the follow up HRCT in 6 month ( Oct. 2017) Additionally he agrees to follow up CT in 6 months for follow up of nodules and Aortic Coarctation. We discussed the need for thoracic surgery consult to for the Aortic Coarctation. We reviewed the PFT's ( moderate obstructive disease/ mild restrictive disease) and Stress Test ( WNL) I reassured him that there was no Hilar Lymphadenopathy on his CT which was reassuring for him. Additionally we have transitioned him from Lisinopril 20mg / 12.5 mg to Diovan HCT 160/12.5 ( ACE to ARB) to decrease possibility of it contributing to the chronic cough. Patient states he has been using coconut oil which she feels has helped with this cough. He additionally feels he is improving with increased carrot intake. Patient currently does not have a PCP. We are [facility] .  Current outpatient prescriptions:  .  albuterol (PROAIR HFA) 108 (90 Base) MCG/ACT inhaler, Inhale 1-2 puffs into the lungs every 6 (six) hours as needed for wheezing or shortness of breath., Disp: , Rfl:  .  budesonide-formoterol (SYMBICORT) 80-4.5 MCG/ACT inhaler, Inhale 2 puffs into the lungs 2 (two) times daily., Disp: , Rfl:  .  levothyroxine (SYNTHROID, LEVOTHROID) 200 MCG tablet, Take 200 mcg by mouth daily before breakfast., Disp: , Rfl:  .  valsartan-hydrochlorothiazide (DIOVAN HCT) 160-12.5 MG tablet, Take 1 tablet by mouth daily., Disp: 30 tablet, Rfl: 0   Past Medical History  Diagnosis Date  . Chronic cough   . Hypertension   . Pre-diabetes     No Known Allergies     Review of Systems Constitutional:   No  weight loss, night sweats,  Fevers, chills, fatigue, or  lassitude.  HEENT:   No headaches,  Difficulty swallowing,  Tooth/dental problems, or  Sore throat,                No sneezing, itching, ear ache, nasal congestion,+ post nasal  drip,   CV:  No chest pain,  Orthopnea, PND, swelling in lower extremities, anasarca, dizziness, palpitations, syncope.   GI  No heartburn, indigestion, abdominal pain, nausea, vomiting, diarrhea, change in bowel habits, loss of appetite, bloody stools.   Resp: No shortness of breath with exertion or at rest.  No excess mucus, no productive cough,  + non-productive cough,  No coughing up of blood.  No change in color of mucus.  No wheezing.  No chest wall deformity  Skin: no rash or lesions.  GU: no dysuria, change in color of urine, no urgency or frequency.  No flank pain, no hematuria   MS:  No joint pain or swelling.  No decreased range of motion.  No back pain.  Psych:  No change in mood or affect. No depression or anxiety.  No memory loss.      Objective:   Physical Exam BP 142/72 mmHg  Pulse 75  Temp(Src) 98.2 F (36.8 C) (Oral)  Ht 5\' 11"  (1.803 m)  Wt 225 lb 9.6 oz (102.331 kg)  BMI 31.48 kg/m2  SpO2 97%   Physical Exam:  General- No distress,  A&Ox3, obese ENT: No sinus tenderness, TM clear, pale nasal mucosa, no oral exudate,+ post nasal drip, no LAN Cardiac: S1, S2, regular rate and rhythm, no murmur Chest: No wheeze/ rales/ dullness; no accessory muscle  use, no nasal flaring, no sternal retractions Abd.: Soft Non-tender Ext: No clubbing cyanosis, edema Neuro:  normal strength Skin: No rashes, warm and dry Psych: normal mood and behavior  Magdalen Spatz, AGACNP-BC [facility]  09/07/2015      Assessment & Plan:

## 2015-09-07 NOTE — Patient Instructions (Addendum)
It is nice to meet you today. We will schedule lab work as continued follow up of the possible subacute hypersensitivity pneumonitis Schedule for repeat HRCT in 6 months ( Oct 10 th,2017) We will refer you to [facility] for follow up of Aortic Coarctation. We will send in a prescription for  Diovan 160/12.5 once daily This will replace the Lisinopril you take daily Add Claritin for allergies Flonase nasal spray 2 sprays per nostril each morning. Follow up with Dr. Chase Caller in 3 months Referral for PCP. Please contact office for sooner follow up if symptoms do not improve or worsen or seek emergency care

## 2015-09-07 NOTE — Assessment & Plan Note (Signed)
Aortic coarctation with focal 50- 60% stenosis of the thoracic aorta at the junction of the aortic isthmus and descending thoracic aorta, probably congenital, of uncertain clinical/hemodynamic significance Plan: Follow Up CT in 6 months ( 02/2016) Ambulatory referral to [facility]

## 2015-09-07 NOTE — Assessment & Plan Note (Addendum)
Chronic cough: Plan ACE changed to ARB: We will send in a prescription for  Diovan 160/12.5 once daily This will replace the Lisinopril you take daily Add Claritin for allergies Flonase nasal spray 2 sprays per nostril each morning. Follow up with Dr. Chase Caller in 3 months Referral for PCP. Please contact office for sooner follow up if symptoms do not improve or worsen or seek emergency care

## 2015-09-07 NOTE — Assessment & Plan Note (Addendum)
Stable Pulmonary nodules: Plan: Follow UP HRCT in 6 months( 02/2016)

## 2015-09-07 NOTE — Assessment & Plan Note (Signed)
   Two Vessel Coronary Atherosclerosis  Plan: Echo Stress Test:  Study Conclusions  - Baseline ECG: Normal sinus rhythm. - Stress ECG conclusions: Sinus tachycardia with PAC&'s. No ischemic  EKG changes. - Baseline: LVEF 55-60%, normal wall motion and thickening. - Peak stress: LVEF 70-75%, hyperdynamic LV function with normal  wall motion and thickening. - Recovery: LVEF 55-60%, normal wall motion and thickening.  Impressions:  - No electrocardiographic or echocardiographic evidence for  exercise induced ischemia. Fair exercise tolerance with a  hypertensive response to exercise.

## 2015-09-08 LAB — ANTI-DNA ANTIBODY, DOUBLE-STRANDED: ds DNA Ab: <1 IU/mL

## 2015-09-08 LAB — CYCLIC CITRUL PEPTIDE ANTIBODY, IGG

## 2015-09-08 LAB — SJOGRENS SYNDROME-A EXTRACTABLE NUCLEAR ANTIBODY: SSA (Ro) (ENA) Antibody, IgG: <1

## 2015-09-08 LAB — ANTI-SCLERODERMA ANTIBODY: Scleroderma (Scl-70) (ENA) Antibody, IgG: <1

## 2015-09-08 LAB — SJOGRENS SYNDROME-B EXTRACTABLE NUCLEAR ANTIBODY: SSB (LA) (ENA) ANTIBODY, IGG: NEGATIVE

## 2015-09-09 NOTE — Progress Notes (Signed)
Quick Note:  Called and spoke to pt. Informed him of the results per MR. Pt verbalized understanding and denied any further questions or concerns at this time.   ______ 

## 2015-09-12 LAB — HYPERSENSITIVITY PNUEMONITIS PROFILE

## 2015-09-16 ENCOUNTER — Encounter: Payer: No Typology Code available for payment source | Admitting: Cardiothoracic Surgery

## 2015-09-21 ENCOUNTER — Telehealth: Payer: Self-pay | Admitting: Internal Medicine

## 2015-09-21 MED ORDER — VALSARTAN-HYDROCHLOROTHIAZIDE 160-12.5 MG PO TABS: 1.0000 | ORAL_TABLET | Freq: Every day | ORAL | Status: DC

## 2015-09-21 NOTE — Telephone Encounter (Signed)
Requesting copy of pt's PFT; copy given to pt's wife (dpr on file). Requesting printed diovan hct rx to send to pharmacy in [place].  Rx printed and given to pt's wife.  Future refills will need to be deferred to PCP per SG.  Wife aware. Nothing further needed.

## 2016-05-05 DIAGNOSIS — Z79899 Other long term (current) drug therapy: Secondary | ICD-10-CM | POA: Diagnosis not present

## 2016-05-05 DIAGNOSIS — Z1211 Encounter for screening for malignant neoplasm of colon: Secondary | ICD-10-CM | POA: Diagnosis not present

## 2016-05-05 DIAGNOSIS — E039 Hypothyroidism, unspecified: Secondary | ICD-10-CM | POA: Diagnosis not present

## 2016-05-05 DIAGNOSIS — I10 Essential (primary) hypertension: Secondary | ICD-10-CM | POA: Diagnosis not present

## 2016-05-05 DIAGNOSIS — J4520 Mild intermittent asthma, uncomplicated: Secondary | ICD-10-CM | POA: Diagnosis not present

## 2016-05-05 DIAGNOSIS — Z23 Encounter for immunization: Secondary | ICD-10-CM | POA: Diagnosis not present

## 2016-05-26 ENCOUNTER — Ambulatory Visit: Payer: Medicare Other | Admitting: Neurology

## 2016-05-26 DIAGNOSIS — R1013 Epigastric pain: Secondary | ICD-10-CM | POA: Diagnosis not present

## 2016-05-26 DIAGNOSIS — Z1211 Encounter for screening for malignant neoplasm of colon: Secondary | ICD-10-CM | POA: Diagnosis not present

## 2016-06-14 ENCOUNTER — Encounter: Payer: Self-pay | Admitting: Neurology

## 2016-06-14 ENCOUNTER — Ambulatory Visit (INDEPENDENT_AMBULATORY_CARE_PROVIDER_SITE_OTHER): Payer: Medicare Other | Admitting: Neurology

## 2016-06-14 VITALS — BP 169/78 | HR 78 | Ht 71.0 in | Wt 232.0 lb

## 2016-06-14 DIAGNOSIS — E538 Deficiency of other specified B group vitamins: Secondary | ICD-10-CM

## 2016-06-14 DIAGNOSIS — R413 Other amnesia: Secondary | ICD-10-CM

## 2016-06-14 DIAGNOSIS — R0989 Other specified symptoms and signs involving the circulatory and respiratory systems: Secondary | ICD-10-CM

## 2016-06-14 HISTORY — DX: Other amnesia: R41.3

## 2016-06-14 MED ORDER — DONEPEZIL HCL 5 MG PO TABS: 5.0000 mg | ORAL_TABLET | Freq: Every day | ORAL | 1 refills | Status: DC

## 2016-06-14 NOTE — Progress Notes (Signed)
Reason for visit: Memory disturbance  Referring physician: Dr. Odetta Pink is a 66 y.o. male  History of present illness:  Edwin Stewart is a 66 year old right-handed white male with a history of a progressive memory disturbance over the last 6-9 months. The patient is a retired Optometrist. During the tax season of 2017, he indicated that he was not interested in doing their personal taxes again that year. Particularly within the last 6 months, he has had increasing problems with word finding, he has difficulty having a conversation with someone because of this. He has also had some issues with short-term memory, he has difficulty remembering recent events. Occasionally he may misplace things about the house. He does operate a motor vehicle, but he is having some problems with directions, particularly at night. He is able to keep up with medications and appointments fairly well. The patient in general sleeps fairly well at night, he does snore some but not severely. He reports no problems with significant fatigue during the day, but he will occasionally nod off when he is inactive. The patient reports no focal numbness or weakness of the face, arms, or legs. He denies any significant changes in balance but he has been shuffling his feet more recently. The patient reports no problems controlling the bowels or the bladder. He denies any other issues such as night sweats, dizziness, or headache. There is no significant family history of memory disturbances. He is sent to this office for an evaluation. He has had some hypothyroidism, he has had some recent adjustments to his thyroid dosing.  Past Medical History:  Diagnosis Date  . Chronic cough   . Hypertension   . Memory difficulty 06/14/2016  . Pre-diabetes     Past Surgical History:  Procedure Laterality Date  . BUNIONECTOMY  2016    Family History  Problem Relation Age of Onset  . Asthma Maternal Uncle   . Emphysema Maternal  Uncle   . Diabetes Mellitus II Father   . Renal Disease Brother   . Diabetes Mellitus II Brother   . Alzheimer's disease Mother     Social history:  reports that he has never smoked. He has never used smokeless tobacco. He reports that he drinks alcohol. He reports that he does not use drugs.  Medications:  Prior to Admission medications   Medication Sig Start Date End Date Taking? Authorizing Provider  albuterol (PROAIR HFA) 108 (90 Base) MCG/ACT inhaler Inhale 1-2 puffs into the lungs every 6 (six) hours as needed for wheezing or shortness of breath.   Yes Historical Provider, MD  budesonide-formoterol (SYMBICORT) 80-4.5 MCG/ACT inhaler Inhale 2 puffs into the lungs 2 (two) times daily.   Yes Historical Provider, MD  levothyroxine (SYNTHROID, LEVOTHROID) 200 MCG tablet Take 200 mcg by mouth daily before breakfast.   Yes Historical Provider, MD  valsartan-hydrochlorothiazide (DIOVAN HCT) 160-12.5 MG tablet Take 1 tablet by mouth daily. 09/21/15  Yes Magdalen Spatz, NP  donepezil (ARICEPT) 5 MG tablet Take 1 tablet (5 mg total) by mouth at bedtime. 06/14/16   Kathrynn Ducking, MD     No Known Allergies  ROS:  Out of a complete 14 system review of symptoms, the patient complains only of the following symptoms, and all other reviewed systems are negative.  Cough, snoring Confusion Anxiety, decreased energy, disinterest in activities  Blood pressure (!) 169/78, pulse 78, height 5\' 11"  (1.803 m), weight 232 lb (105.2 kg).  Physical Exam  General: The  patient is alert and cooperative at the time of the examination. The patient is moderately to markedly obese.  Eyes: Pupils are equal, round, and reactive to light. Discs are flat bilaterally.  Neck: The neck is supple, bilateral carotid bruits are noted.  Respiratory: The respiratory examination is clear, with exception of occasional wheezes anteriorly and posteriorly.  Cardiovascular: The cardiovascular examination reveals a regular  rate and rhythm, a grade II/VI systolic ejection murmur is noted in the aortic area.  Skin: Extremities are without significant edema.  Neurologic Exam  Mental status: The patient is alert and oriented x 3 at the time of the examination. The Mini-Mental Status Examination done today shows a total score of 19/30.  Cranial nerves: Facial symmetry is present. There is good sensation of the face to pinprick and soft touch bilaterally. The strength of the facial muscles and the muscles to head turning and shoulder shrug are normal bilaterally. Speech is well enunciated, no aphasia or dysarthria is noted. Extraocular movements are full. Visual fields are full. The tongue is midline, and the patient has symmetric elevation of the soft palate. No obvious hearing deficits are noted.  Motor: The motor testing reveals 5 over 5 strength of all 4 extremities. Good symmetric motor tone is noted throughout.  Sensory: Sensory testing is intact to pinprick, soft touch, vibration sensation, and position sense on all 4 extremities. No evidence of extinction is noted.  Coordination: Cerebellar testing reveals good finger-nose-finger and heel-to-shin bilaterally.  Gait and station: Gait is normal. Tandem gait is normal. Romberg is negative. No drift is seen.  Reflexes: Deep tendon reflexes are symmetric and normal bilaterally. Toes are downgoing bilaterally.   Assessment/Plan:  1. Progressive memory disturbance  2. Bilateral carotid bruits, questionable radiation from heart murmur  The patient has a high level of education, he is currently scoring in an upper moderate range of dementia. The rate of progression appears to be relatively rapid over the last 9 months. The patient will be set up for blood work today, he will have MRI evaluation of the brain. He will have a carotid Doppler study to evaluate the carotid bruits. The patient will follow-up in 6 months, we will start Aricept in low dose and go up to the  10 mg dosing after one month. I will contact him regarding the results of the above testing.  Jill Alexanders MD 06/14/2016 10:20 AM  [facility] [address] [place], [place] [address]  Phone [phone] Fax [phone]

## 2016-06-14 NOTE — Patient Instructions (Addendum)
   We will check blood work today and get MRI of the brain and a carotid doppler study.  We will start Aricept.  Begin Aricept (donepezil) at 5 mg at night for one month. If this medication is well-tolerated, please call our office and we will call in a prescription for the 10 mg tablets. Look out for side effects that may include nausea, diarrhea, weight loss, or stomach cramps. This medication will also cause a runny nose, therefore there is no need for allergy medications for this purpose.

## 2016-06-15 DIAGNOSIS — E039 Hypothyroidism, unspecified: Secondary | ICD-10-CM | POA: Diagnosis not present

## 2016-06-15 LAB — SEDIMENTATION RATE: Sed Rate: 2 mm/hr (ref 0–30)

## 2016-06-15 LAB — RPR: RPR: NONREACTIVE

## 2016-06-15 LAB — VITAMIN B12: VITAMIN B 12: 1002 pg/mL (ref 232–1245)

## 2016-06-16 ENCOUNTER — Other Ambulatory Visit: Payer: Medicare Other

## 2016-06-23 ENCOUNTER — Ambulatory Visit (INDEPENDENT_AMBULATORY_CARE_PROVIDER_SITE_OTHER): Payer: Medicare Other

## 2016-06-23 ENCOUNTER — Other Ambulatory Visit: Payer: Medicare Other

## 2016-06-23 DIAGNOSIS — R0989 Other specified symptoms and signs involving the circulatory and respiratory systems: Secondary | ICD-10-CM | POA: Diagnosis not present

## 2016-06-24 ENCOUNTER — Telehealth: Payer: Self-pay | Admitting: Neurology

## 2016-06-24 NOTE — Telephone Encounter (Signed)
I called the patient. The carotid Doppler study was unremarkable. MRI of the brain is pending.  The carotid bruits are likely related to a radiation from a heart murmur.

## 2016-06-29 ENCOUNTER — Ambulatory Visit
Admission: RE | Admit: 2016-06-29 | Discharge: 2016-06-29 | Disposition: A | Discharge status: Home / Self Care | Payer: Medicare Other | Source: Ambulatory Visit | Attending: Neurology | Admitting: Neurology

## 2016-06-29 DIAGNOSIS — R413 Other amnesia: Secondary | ICD-10-CM | POA: Diagnosis not present

## 2016-06-30 ENCOUNTER — Other Ambulatory Visit: Payer: Medicare Other

## 2016-07-02 ENCOUNTER — Telehealth: Payer: Self-pay | Admitting: Neurology

## 2016-07-02 NOTE — Telephone Encounter (Signed)
I called the patient. The MRI shows atrophy out of proportion to age, probable SDAT.   MRi brain 06/29/16:  IMPRESSION:  Abnormal MRI brain (without) demonstrating: 1. Mild-moderate diffuse and moderate-severe left temporal atrophy. 2. Mild periventricular and subcortical chronic small vessel ischemic disease.  3. No acute findings.

## 2016-07-04 DIAGNOSIS — K2930 Chronic superficial gastritis without bleeding: Secondary | ICD-10-CM | POA: Diagnosis not present

## 2016-07-04 DIAGNOSIS — Z1211 Encounter for screening for malignant neoplasm of colon: Secondary | ICD-10-CM | POA: Diagnosis not present

## 2016-07-04 DIAGNOSIS — R1013 Epigastric pain: Secondary | ICD-10-CM | POA: Diagnosis not present

## 2016-07-04 DIAGNOSIS — K648 Other hemorrhoids: Secondary | ICD-10-CM | POA: Diagnosis not present

## 2016-07-04 DIAGNOSIS — K5730 Diverticulosis of large intestine without perforation or abscess without bleeding: Secondary | ICD-10-CM | POA: Diagnosis not present

## 2016-07-04 DIAGNOSIS — D126 Benign neoplasm of colon, unspecified: Secondary | ICD-10-CM | POA: Diagnosis not present

## 2016-07-05 NOTE — Telephone Encounter (Signed)
Dr Willis- please advise 

## 2016-07-05 NOTE — Telephone Encounter (Signed)
Patients wife called wanting to see about getting the results of patients MRI again please, and also if they can receive a copy of the report.  Please call

## 2016-07-05 NOTE — Telephone Encounter (Signed)
I called the wife. I discussed the results of the MRI study again with her, I suspect the patient does have an Alzheimer's disease process.  We discussed the issues with progression of the disease. The patient is already having some problems with driving at nighttime. The driving issue will need to be scrutinized closely over time.  The patient will stay on Aricept, follow-up in the office. They have requested a copy of the MRI report.

## 2016-07-08 DIAGNOSIS — Z1211 Encounter for screening for malignant neoplasm of colon: Secondary | ICD-10-CM | POA: Diagnosis not present

## 2016-07-08 DIAGNOSIS — K2930 Chronic superficial gastritis without bleeding: Secondary | ICD-10-CM | POA: Diagnosis not present

## 2016-07-08 DIAGNOSIS — D126 Benign neoplasm of colon, unspecified: Secondary | ICD-10-CM | POA: Diagnosis not present

## 2016-07-12 ENCOUNTER — Telehealth: Payer: Self-pay | Admitting: Neurology

## 2016-07-12 MED ORDER — DONEPEZIL HCL 10 MG PO TABS: 10.0000 mg | ORAL_TABLET | Freq: Every day | ORAL | 2 refills | Status: DC

## 2016-07-12 NOTE — Telephone Encounter (Signed)
Dr Jannifer Franklin- ok to call in 10mg ?

## 2016-07-12 NOTE — Addendum Note (Signed)
Addended by: Margette Fast on: 07/12/2016 08:47 AM   Modules accepted: Orders

## 2016-07-12 NOTE — Telephone Encounter (Signed)
Patient's wife calling. Patient has taken donepezil (ARICEPT) 5 MG tablet for a month and needs a new Rx called in for 10 mg. Please call to [facility] in [place].

## 2016-07-12 NOTE — Telephone Encounter (Signed)
The patient will be changed to 10 mg of Aricept.

## 2016-08-31 DIAGNOSIS — F320 Major depressive disorder, single episode, mild: Secondary | ICD-10-CM | POA: Diagnosis not present

## 2016-08-31 DIAGNOSIS — Z23 Encounter for immunization: Secondary | ICD-10-CM | POA: Diagnosis not present

## 2016-08-31 DIAGNOSIS — I10 Essential (primary) hypertension: Secondary | ICD-10-CM | POA: Diagnosis not present

## 2016-08-31 DIAGNOSIS — Z0000 Encounter for general adult medical examination without abnormal findings: Secondary | ICD-10-CM | POA: Diagnosis not present

## 2016-08-31 DIAGNOSIS — Z13220 Encounter for screening for lipoid disorders: Secondary | ICD-10-CM | POA: Diagnosis not present

## 2016-08-31 DIAGNOSIS — Z125 Encounter for screening for malignant neoplasm of prostate: Secondary | ICD-10-CM | POA: Diagnosis not present

## 2016-08-31 DIAGNOSIS — E039 Hypothyroidism, unspecified: Secondary | ICD-10-CM | POA: Diagnosis not present

## 2016-08-31 DIAGNOSIS — Z79899 Other long term (current) drug therapy: Secondary | ICD-10-CM | POA: Diagnosis not present

## 2016-08-31 DIAGNOSIS — J4520 Mild intermittent asthma, uncomplicated: Secondary | ICD-10-CM | POA: Diagnosis not present

## 2016-09-13 DIAGNOSIS — R7301 Impaired fasting glucose: Secondary | ICD-10-CM | POA: Diagnosis not present

## 2016-10-03 DIAGNOSIS — I10 Essential (primary) hypertension: Secondary | ICD-10-CM | POA: Diagnosis not present

## 2016-10-03 DIAGNOSIS — E119 Type 2 diabetes mellitus without complications: Secondary | ICD-10-CM | POA: Diagnosis not present

## 2016-10-18 DIAGNOSIS — I10 Essential (primary) hypertension: Secondary | ICD-10-CM | POA: Diagnosis not present

## 2016-10-18 DIAGNOSIS — E039 Hypothyroidism, unspecified: Secondary | ICD-10-CM | POA: Diagnosis not present

## 2016-12-12 ENCOUNTER — Encounter: Payer: Self-pay | Admitting: Neurology

## 2016-12-12 ENCOUNTER — Ambulatory Visit (INDEPENDENT_AMBULATORY_CARE_PROVIDER_SITE_OTHER): Payer: Medicare Other | Admitting: Neurology

## 2016-12-12 VITALS — BP 152/70 | HR 87

## 2016-12-12 DIAGNOSIS — R413 Other amnesia: Secondary | ICD-10-CM

## 2016-12-12 NOTE — Progress Notes (Signed)
Reason for visit: Memory disturbance  Edwin Stewart is an 66 y.o. male  History of present illness:  Edwin Stewart is a 66 year old right-handed white male with a history of progressive memory disturbance. The patient had worked as a Engineer, maintenance (IT), he is retired at this time. The patient is on Aricept, he is tolerating this medication well. He still operates a Teacher, music, he is having some problems with directions with driving at times. The patient has not had any safety issues with driving. The patient has continued to have chronic issues with insomnia, this has been present for several decades. The patient has not had any other new medical issues that have come up since last seen. He returns for an evaluation.  Past Medical History:  Diagnosis Date  . Chronic cough   . Hypertension   . Memory difficulty 06/14/2016  . Pre-diabetes     Past Surgical History:  Procedure Laterality Date  . BUNIONECTOMY  2016    Family History  Problem Relation Age of Onset  . Asthma Maternal Uncle   . Emphysema Maternal Uncle   . Diabetes Mellitus II Father   . Renal Disease Brother   . Diabetes Mellitus II Brother   . Alzheimer's disease Mother     Social history:  reports that he has never smoked. He has never used smokeless tobacco. He reports that he drinks alcohol. He reports that he does not use drugs.   No Known Allergies  Medications:  Prior to Admission medications   Medication Sig Start Date End Date Taking? Authorizing Provider  albuterol (PROAIR HFA) 108 (90 Base) MCG/ACT inhaler Inhale 1-2 puffs into the lungs every 6 (six) hours as needed for wheezing or shortness of breath.   Yes [provider]  budesonide-formoterol (SYMBICORT) 80-4.5 MCG/ACT inhaler Inhale 2 puffs into the lungs as needed.    Yes [provider]  donepezil (ARICEPT) 10 MG tablet Take 1 tablet (10 mg total) by mouth at bedtime. 07/12/16  Yes Kathrynn Ducking, MD  levothyroxine (SYNTHROID,  LEVOTHROID) 200 MCG tablet Take 166 mcg by mouth daily before breakfast.    Yes [provider]  valsartan-hydrochlorothiazide (DIOVAN-HCT) 320-25 MG tablet Take 1 tablet by mouth daily. 10/03/16  Yes [provider]    ROS:  Out of a complete 14 system review of symptoms, the patient complains only of the following symptoms, and all other reviewed systems are negative.  Memory loss, speech difficulty Agitation, behavior problem Chest tightness  Blood pressure (!) 152/70, pulse 87, SpO2 98 %.  Physical Exam  General: The patient is alert and cooperative at the time of the examination. The patient is moderately obese.  Skin: No significant peripheral edema is noted.   Neurologic Exam  Mental status: The patient is alert and oriented x 2 at the time of the examination (not oriented to date). The Mini-Mental Status Examination done today shows a total score 14/30.   Cranial nerves: Facial symmetry is present. Speech is normal, no aphasia or dysarthria is noted. Extraocular movements are full. Visual fields are full.  Motor: The patient has good strength in all 4 extremities.  Sensory examination: Soft touch sensation is symmetric on the face, arms, and legs.  Coordination: The patient has good finger-nose-finger and heel-to-shin bilaterally. Mild apraxia with the use of the extremities is noted.  Gait and station: The patient has a normal gait. Tandem gait is normal. Romberg is negative. No drift is seen.  Reflexes: Deep tendon reflexes  are symmetric.   MRi brain 06/29/16:  IMPRESSION:  Abnormal MRI brain (without) demonstrating: 1. Mild-moderate diffuse and moderate-severe left temporal atrophy. 2. Mild periventricular and subcortical chronic small vessel ischemic disease.  3. No acute findings.   Assessment/Plan:  1. Progressive memory disorder  The patient likely has Alzheimer's disease. He is on Aricept, we have discussed the possibility of  starting Namenda, he does not wish to do this at this time. The driving issue will need to be scrutinized closely. The patient is having some word finding problems. He will follow-up in 6 months, sooner if needed. They will call me if they decide to go on Namenda.  Jill Alexanders MD 12/12/2016 4:28 PM  [facility] [address] [place], [place] [address]  Phone [phone] Fax [phone]

## 2016-12-19 DIAGNOSIS — E039 Hypothyroidism, unspecified: Secondary | ICD-10-CM | POA: Diagnosis not present

## 2017-01-17 DIAGNOSIS — I10 Essential (primary) hypertension: Secondary | ICD-10-CM | POA: Diagnosis not present

## 2017-01-17 DIAGNOSIS — E119 Type 2 diabetes mellitus without complications: Secondary | ICD-10-CM | POA: Diagnosis not present

## 2017-01-17 DIAGNOSIS — J4520 Mild intermittent asthma, uncomplicated: Secondary | ICD-10-CM | POA: Diagnosis not present

## 2017-01-17 DIAGNOSIS — E039 Hypothyroidism, unspecified: Secondary | ICD-10-CM | POA: Diagnosis not present

## 2017-02-22 DIAGNOSIS — Z23 Encounter for immunization: Secondary | ICD-10-CM | POA: Diagnosis not present

## 2017-03-21 DIAGNOSIS — E1165 Type 2 diabetes mellitus with hyperglycemia: Secondary | ICD-10-CM | POA: Diagnosis not present

## 2017-03-21 DIAGNOSIS — E039 Hypothyroidism, unspecified: Secondary | ICD-10-CM | POA: Diagnosis not present

## 2017-04-03 ENCOUNTER — Other Ambulatory Visit: Payer: Self-pay | Admitting: Neurology

## 2017-06-15 ENCOUNTER — Telehealth: Payer: Self-pay | Admitting: Neurology

## 2017-06-15 NOTE — Telephone Encounter (Addendum)
Pt's wife called to confirmed appt on Monday with Megan. In talking with her she said they have moved into a new house November 30th and he still gets lost in the house. She said last OV with Dr Jannifer Franklin he mentioned prescribing namenda. She is wanting to discuss this at the upcoming appt. Also she said he gets embarrassed about getting lost. Juluis Rainier

## 2017-06-19 ENCOUNTER — Encounter: Payer: Self-pay | Admitting: Adult Health

## 2017-06-19 ENCOUNTER — Ambulatory Visit (INDEPENDENT_AMBULATORY_CARE_PROVIDER_SITE_OTHER): Payer: Medicare Other | Admitting: Adult Health

## 2017-06-19 DIAGNOSIS — F0390 Unspecified dementia without behavioral disturbance: Secondary | ICD-10-CM
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
## 2017-06-19 NOTE — Progress Notes (Signed)
The blood work results are unremarkable. Please call the patient.  

## 2017-06-19 NOTE — Patient Instructions (Addendum)
Your Plan:  Continue Aricept  Memory score is stable Should stop driving If your symptoms worsen or you develop new symptoms please let us know.   Thank you for coming to see [place] at [facility]. I hope we have been able to provide you high quality care today.  You may receive a patient satisfaction survey over the next few weeks. We would appreciate your feedback and comments so that we may continue to improve ourselves and the health of our patients.

## 2017-06-20 DIAGNOSIS — E039 Hypothyroidism, unspecified: Secondary | ICD-10-CM | POA: Diagnosis not present

## 2017-06-21 ENCOUNTER — Telehealth: Payer: Self-pay | Admitting: Adult Health

## 2017-06-21 NOTE — Telephone Encounter (Signed)
Pt's wife called she said at the appt on 1/28 when taking the memory test he did better on the drawing part than answering the questions. She is wanting to know if would help to determine what type of dementia he has. Please call to advise

## 2017-06-21 NOTE — Telephone Encounter (Signed)
error 

## 2017-06-22 NOTE — Telephone Encounter (Signed)
Memory testing allows [place] to track his memory.  Different parts of the test would not distinguish what type of dementia he has.  Most likely he has Alzheimer's dementia.  As this is the most common type of dementia.

## 2017-06-23 NOTE — Telephone Encounter (Signed)
I spoke to wife of pt and relayed the message below.  She verbalized understanding.

## 2017-09-06 ENCOUNTER — Telehealth: Payer: Self-pay | Admitting: *Deleted

## 2017-09-06 DIAGNOSIS — Z23 Encounter for immunization: Secondary | ICD-10-CM | POA: Diagnosis not present

## 2017-09-06 DIAGNOSIS — Z125 Encounter for screening for malignant neoplasm of prostate: Secondary | ICD-10-CM | POA: Diagnosis not present

## 2017-09-06 DIAGNOSIS — Z79899 Other long term (current) drug therapy: Secondary | ICD-10-CM | POA: Diagnosis not present

## 2017-09-06 DIAGNOSIS — M67449 Ganglion, unspecified hand: Secondary | ICD-10-CM | POA: Diagnosis not present

## 2017-09-06 DIAGNOSIS — I10 Essential (primary) hypertension: Secondary | ICD-10-CM | POA: Diagnosis not present

## 2017-09-06 DIAGNOSIS — E039 Hypothyroidism, unspecified: Secondary | ICD-10-CM | POA: Diagnosis not present

## 2017-09-06 DIAGNOSIS — J4520 Mild intermittent asthma, uncomplicated: Secondary | ICD-10-CM | POA: Diagnosis not present

## 2017-09-06 DIAGNOSIS — R011 Cardiac murmur, unspecified: Secondary | ICD-10-CM | POA: Diagnosis not present

## 2017-09-06 DIAGNOSIS — D649 Anemia, unspecified: Secondary | ICD-10-CM | POA: Diagnosis not present

## 2017-09-06 DIAGNOSIS — E1165 Type 2 diabetes mellitus with hyperglycemia: Secondary | ICD-10-CM | POA: Diagnosis not present

## 2017-09-06 DIAGNOSIS — K219 Gastro-esophageal reflux disease without esophagitis: Secondary | ICD-10-CM | POA: Diagnosis not present

## 2017-09-06 DIAGNOSIS — E119 Type 2 diabetes mellitus without complications: Secondary | ICD-10-CM | POA: Diagnosis not present

## 2017-09-06 DIAGNOSIS — Z0001 Encounter for general adult medical examination with abnormal findings: Secondary | ICD-10-CM | POA: Diagnosis not present

## 2017-09-06 NOTE — Telephone Encounter (Signed)
Fax referral from [facility] Dorthy Cooler, MD of [facility] placed in scheduling box

## 2017-09-11 DIAGNOSIS — D649 Anemia, unspecified: Secondary | ICD-10-CM | POA: Diagnosis not present

## 2017-09-19 ENCOUNTER — Other Ambulatory Visit (HOSPITAL_COMMUNITY): Payer: Self-pay | Admitting: Family Medicine

## 2017-09-19 DIAGNOSIS — R011 Cardiac murmur, unspecified: Secondary | ICD-10-CM

## 2017-10-09 ENCOUNTER — Ambulatory Visit (HOSPITAL_COMMUNITY): Payer: Medicare Other | Attending: Cardiology

## 2017-10-09 ENCOUNTER — Other Ambulatory Visit: Payer: Self-pay

## 2017-10-09 DIAGNOSIS — R011 Cardiac murmur, unspecified: Secondary | ICD-10-CM | POA: Insufficient documentation

## 2017-10-09 DIAGNOSIS — Q231 Congenital insufficiency of aortic valve: Secondary | ICD-10-CM | POA: Diagnosis not present

## 2017-10-09 DIAGNOSIS — I517 Cardiomegaly: Secondary | ICD-10-CM | POA: Diagnosis not present

## 2017-10-09 DIAGNOSIS — I77810 Thoracic aortic ectasia: Secondary | ICD-10-CM | POA: Insufficient documentation

## 2017-10-12 DIAGNOSIS — E611 Iron deficiency: Secondary | ICD-10-CM | POA: Diagnosis not present

## 2017-10-17 ENCOUNTER — Encounter: Payer: Self-pay | Admitting: Neurology

## 2017-11-08 ENCOUNTER — Ambulatory Visit (INDEPENDENT_AMBULATORY_CARE_PROVIDER_SITE_OTHER): Payer: Medicare Other | Admitting: Neurology

## 2017-11-08 ENCOUNTER — Encounter: Payer: Self-pay | Admitting: Neurology

## 2017-11-08 DIAGNOSIS — G309 Alzheimer's disease, unspecified: Secondary | ICD-10-CM

## 2017-11-08 DIAGNOSIS — F0280 Dementia in other diseases classified elsewhere without behavioral disturbance: Secondary | ICD-10-CM | POA: Diagnosis not present

## 2017-11-08 DIAGNOSIS — G300 Alzheimer's disease with early onset: Secondary | ICD-10-CM

## 2017-11-08 HISTORY — DX: Dementia in other diseases classified elsewhere, unspecified severity, without behavioral disturbance, psychotic disturbance, mood disturbance, and anxiety: F02.80

## 2017-11-08 MED ORDER — MEMANTINE HCL 5 MG PO TABS: ORAL_TABLET | ORAL | 0 refills | Status: DC

## 2017-11-08 NOTE — Progress Notes (Signed)
Reason for visit: Alzheimer's disease  Edwin Stewart is an 67 y.o. male  History of present illness:  Edwin Stewart is a 67 year old right-handed white male with a history of a progressive memory disturbance consistent with Alzheimer's disease.  The patient has had significant progression over the last 12 months.  He remains on Aricept 10 mg at night.  He is tolerating this well.  He is having increasing problems with word finding, at times his speech is nonsensical.  The patient no longer operates a motor vehicle.  He is able to bathe and dress himself.  He has no problems with agitation.  He at times may have difficulty sleeping at night, if he wakes up in the middle the night he has difficulty getting back to sleep.  The patient returns for an evaluation.  Past Medical History:  Diagnosis Date  . Alzheimer disease 11/08/2017  . Chronic cough   . Hypertension   . Memory difficulty 06/14/2016  . Pre-diabetes     Past Surgical History:  Procedure Laterality Date  . BUNIONECTOMY  2016    Family History  Problem Relation Age of Onset  . Asthma Maternal Uncle   . Emphysema Maternal Uncle   . Diabetes Mellitus II Father   . Renal Disease Brother   . Diabetes Mellitus II Brother   . Alzheimer's disease Mother     Social history:  reports that he has never smoked. He has never used smokeless tobacco. He reports that he drinks alcohol. He reports that he does not use drugs.   No Known Allergies  Medications:  Prior to Admission medications   Medication Sig Start Date End Date Taking? Authorizing Provider  albuterol (PROAIR HFA) 108 (90 Base) MCG/ACT inhaler Inhale 1-2 puffs into the lungs every 6 (six) hours as needed for wheezing or shortness of breath.   Yes [provider]  budesonide-formoterol (SYMBICORT) 80-4.5 MCG/ACT inhaler Inhale 2 puffs into the lungs as needed.    Yes [provider]  donepezil (ARICEPT) 10 MG tablet TAKE 1 TABLET BY MOUTH AT BEDTIME  04/03/17  Yes Kathrynn Ducking, MD  levothyroxine (SYNTHROID, LEVOTHROID) 150 MCG tablet 150 mcg daily. 05/29/17  Yes [provider]  valsartan-hydrochlorothiazide (DIOVAN-HCT) 320-25 MG tablet Take 1 tablet by mouth daily. 10/03/16  Yes [provider]  memantine (NAMENDA) 5 MG tablet Take 1 tablet daily for one week, then take 1 tablet twice daily for one week, then take 1 tablet in the morning and 2 in the evening for one week, then take 2 tablets twice daily 11/08/17   Kathrynn Ducking, MD    ROS:  Out of a complete 14 system review of symptoms, the patient complains only of the following symptoms, and all other reviewed systems are negative.  Fatigue, weight gain Shortness of breath, choking Abdominal pain Daytime sleepiness Memory loss, speech difficulty Behavior problem, confusion Depression, anxiety  Blood pressure (!) 161/76, pulse 68, height 5\' 11"  (1.803 m), weight 208 lb (94.3 kg).  Physical Exam  General: The patient is alert and cooperative at the time of the examination.  The patient is moderately obese.  Skin: No significant peripheral edema is noted.   Neurologic Exam  Mental status: The patient is alert and oriented x 1 at the time of the examination (oriented only to person). The Mini-Mental status examination done today shows a total score of 8/30.   Cranial nerves: Facial symmetry is present. Speech is well enunciated, significant word finding  problems are noted, at times the speech is nonsensical. Extraocular movements are full. Visual fields are full.  Motor: The patient has good strength in all 4 extremities.  Sensory examination: Soft touch sensation is symmetric on the face, arms, and legs.  Coordination: The patient has good finger-nose-finger and heel-to-shin bilaterally.  The patient does have apraxia with use of the extremities.  Gait and station: The patient has a normal gait. Tandem gait is slightly unsteady. Romberg is  negative. No drift is seen.  Reflexes: Deep tendon reflexes are symmetric.   Assessment/Plan:  1.  Progressive dementia, Alzheimer's disease  The patient is having increasing problems with verbal capacity, he is having significant word finding problems.  His dementia has progressed rapidly over the last year.  We will add Namenda to the Aricept at this time.  The patient will follow-up in 6 months.  Jill Alexanders MD 11/08/2017 3:50 PM  [facility] [address] [place], [place] [address]  Phone [phone] Fax [phone]

## 2017-11-08 NOTE — Progress Notes (Signed)
Faxed printed/signed rx memantine 5mg  to [facility] [place] at [phone]. Received fax confirmation.

## 2017-11-16 DIAGNOSIS — D509 Iron deficiency anemia, unspecified: Secondary | ICD-10-CM | POA: Diagnosis not present

## 2017-11-22 ENCOUNTER — Encounter: Payer: Self-pay | Admitting: Neurology

## 2017-12-04 ENCOUNTER — Other Ambulatory Visit: Payer: Self-pay | Admitting: Neurology

## 2017-12-04 ENCOUNTER — Telehealth: Payer: Self-pay | Admitting: *Deleted

## 2017-12-04 NOTE — Telephone Encounter (Signed)
Called wife. Advised we received refill request for rx namenda. She states he is still refusing to take. They do not want refill. I refused refill request from pharmacy with note. She will call with any further questions/concerns.

## 2017-12-11 DIAGNOSIS — J4520 Mild intermittent asthma, uncomplicated: Secondary | ICD-10-CM | POA: Diagnosis not present

## 2017-12-11 DIAGNOSIS — E611 Iron deficiency: Secondary | ICD-10-CM | POA: Diagnosis not present

## 2017-12-11 DIAGNOSIS — I10 Essential (primary) hypertension: Secondary | ICD-10-CM | POA: Diagnosis not present

## 2017-12-11 DIAGNOSIS — E119 Type 2 diabetes mellitus without complications: Secondary | ICD-10-CM | POA: Diagnosis not present

## 2017-12-11 DIAGNOSIS — E7800 Pure hypercholesterolemia, unspecified: Secondary | ICD-10-CM | POA: Diagnosis not present

## 2017-12-11 DIAGNOSIS — E039 Hypothyroidism, unspecified: Secondary | ICD-10-CM | POA: Diagnosis not present

## 2017-12-25 ENCOUNTER — Ambulatory Visit: Payer: Medicare Other | Admitting: Adult Health

## 2017-12-28 ENCOUNTER — Other Ambulatory Visit: Payer: Self-pay | Admitting: Neurology

## 2018-02-15 IMAGING — CR DG CHEST 2V
2 series · 2 of 2 positions shown · non-contrast
Comparison: None.

CLINICAL DATA: Cough for 3 months.

EXAM:
CHEST  2 VIEW

[w chest pa]
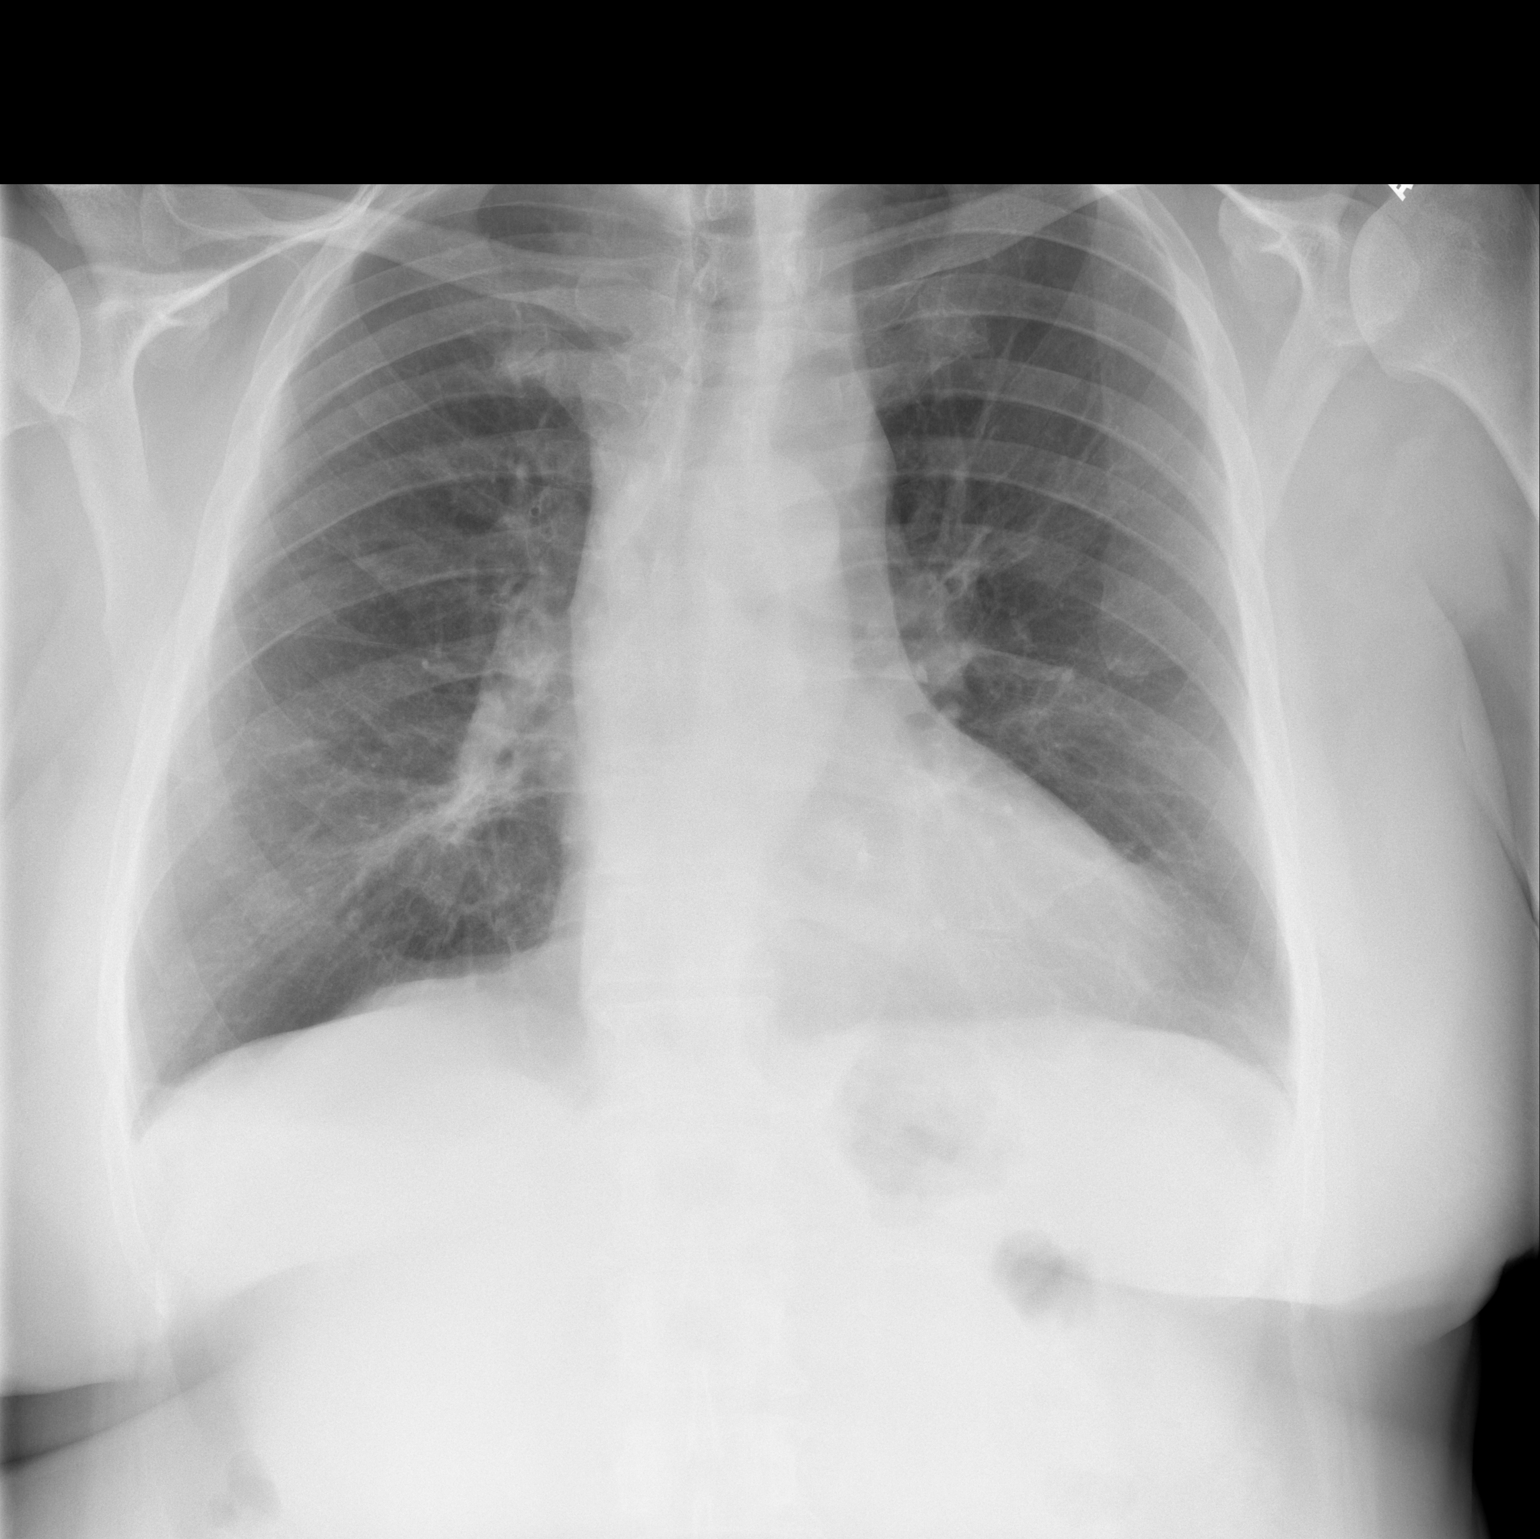

[w chest lat]
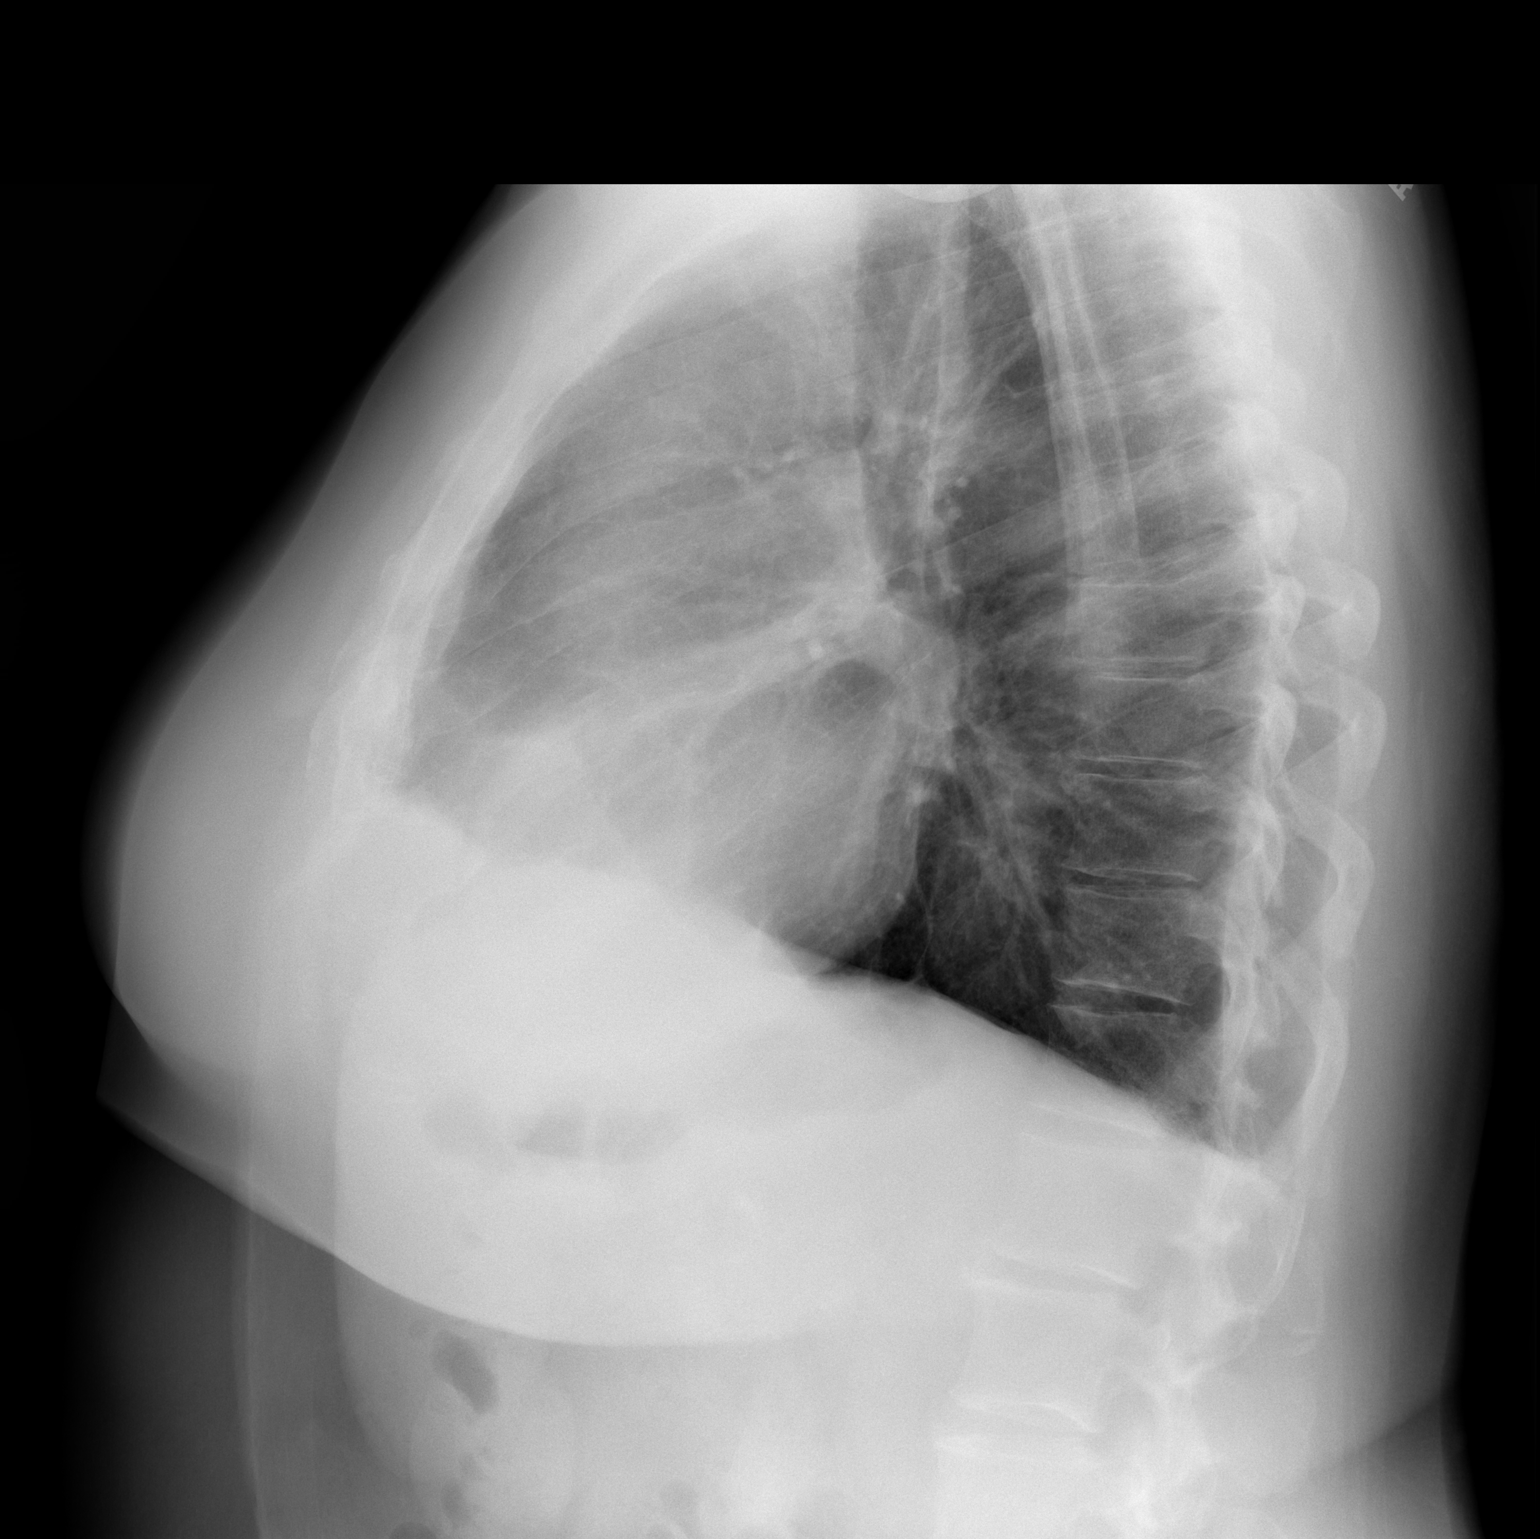

[2 of 2 positions shown; findings below may reference images not displayed]

FINDINGS: Normal heart size. Mildly tortuous thoracic aorta. Mediastinal
contour is otherwise within normal limits. No pneumothorax. No
pleural effusion. There is asymmetric prominence in the right
infrahilar region. No pulmonary edema. Lungs appear hyperinflated.
IMPRESSION: Asymmetric prominence in the right infrahilar region, cannot exclude
adenopathy or parahilar pulmonary nodule. Recommend correlation with
chest CT with IV contrast.

Lungs appear hyperinflated, suggesting obstructive lung disease.

## 2018-03-05 DIAGNOSIS — E039 Hypothyroidism, unspecified: Secondary | ICD-10-CM | POA: Diagnosis not present

## 2018-03-08 DIAGNOSIS — Z23 Encounter for immunization: Secondary | ICD-10-CM | POA: Diagnosis not present

## 2018-05-08 ENCOUNTER — Telehealth: Payer: Self-pay | Admitting: Neurology

## 2018-05-08 NOTE — Telephone Encounter (Signed)
Pts wife Kathryn(on DPR) requesting a call to discuss the stage of the pts dementia. Stating she would just like to know a little bit more about it and what to except.

## 2018-05-08 NOTE — Telephone Encounter (Signed)
I contacted the patient. She stated she wanted to confirm if the patient had been dx with a particular type of Alzheimer disease. I stated at this time we did not have a particular type of Alzheimer diagnosis and the dx listed is the general dx for patient's with Alzheimers. She voiced understanding and had no further questions at this time. MB RN.

## 2018-05-28 ENCOUNTER — Ambulatory Visit: Payer: Medicare Other | Admitting: Adult Health

## 2018-06-11 DIAGNOSIS — E039 Hypothyroidism, unspecified: Secondary | ICD-10-CM | POA: Diagnosis not present

## 2018-07-02 NOTE — Progress Notes (Signed)
PATIENT: Edwin Stewart DOB: 1950-10-05  REASON FOR VISIT: follow up HISTORY FROM: patient  HISTORY OF PRESENT ILLNESS: Today 07/03/18  Edwin Stewart is a 68 year old male with a history of progressive memory disturbance consistent with Alzheimer's Disease. He was first evaluated for his memory in January 2018 by Dr. Jannifer Franklin. At that time he underwent an MRI and blood testing. The MRI was consistent with Alzheimer's Disease. The disease process has had a significant progression over the last 18 months. 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  He currently lives at home with his wife.  He does have a companion who comes 2 days a week to spend time with him.  His companion takes him to lunch and run errands.  His wife currently works full-time and they have limited family support nearby.  He reports that he has a good appetite.  However he is unable to make meals for himself.  His wife used to leave sandwich supplies at home but he is no longer able to make his own sandwich and she is having prepare his lunch.  He has not had any falls.  However his gait is sometimes unsteady.  He does not use a cane or walker.  She reports that he is sleeping at night however he does wake up periodically.  He does stay home by himself some days during the week while his wife is working.  She denies any safety concerns.  She reports that he does clean the sink almost obsessively.  She denies any episodes of wandering.  He is taking the Aricept 10 mg at bedtime however he is not taking the Namenda.  He only took the Namenda once and then he decided he did not want to take it anymore.  He continues to have difficulty with word finding and he will carry on conversations however his words are not logical or rational.  His wife reports it is difficult because she is no longer able to carry on a conversation with him.  He presents for follow-up.  HISTORY (11/08/2017 Dr. Jannifer Franklin) Edwin Stewart is a 68 year old right-handed  white male with a history of a progressive memory disturbance consistent with Alzheimer's disease.  The patient has had significant progression over the last 12 months.  He remains on Aricept 10 mg at night.  He is tolerating this well.  He is having increasing problems with word finding, at times his speech is nonsensical.  The patient no longer operates a motor vehicle.  He is able to bathe and dress himself.  He has no problems with agitation.  He at times may have difficulty sleeping at night, if he wakes up in the middle the night he has difficulty getting back to sleep.  The patient returns for an evaluation.  REVIEW OF SYSTEMS: Out of a complete 14 system review of symptoms, the patient complains only of the following symptoms, and all other reviewed systems are negative.  Memory loss, speech difficulty ALLERGIES: No Known Allergies  HOME MEDICATIONS: Outpatient Medications Prior to Visit  Medication Sig Dispense Refill  . amLODipine (NORVASC) 2.5 MG tablet Take 2.5 mg by mouth daily.    Marland Kitchen atorvastatin (LIPITOR) 10 MG tablet Take 10 mg by mouth daily.    Marland Kitchen levothyroxine (SYNTHROID, LEVOTHROID) 150 MCG tablet 150 mcg daily.  1  . Omeprazole (PRILOSEC PO) Take 1 tablet by mouth daily.    . valsartan-hydrochlorothiazide (DIOVAN-HCT) 320-25 MG tablet Take 1 tablet by mouth daily.  1  . donepezil (ARICEPT) 10 MG tablet TAKE 1 TABLET BY MOUTH AT BEDTIME 90 tablet 2  . albuterol (PROAIR HFA) 108 (90 Base) MCG/ACT inhaler Inhale 1-2 puffs into the lungs every 6 (six) hours as needed for wheezing or shortness of breath.    . budesonide-formoterol (SYMBICORT) 80-4.5 MCG/ACT inhaler Inhale 2 puffs into the lungs as needed.     . memantine (NAMENDA) 5 MG tablet Take 1 tablet daily for one week, then take 1 tablet twice daily for one week, then take 1 tablet in the morning and 2 in the evening for one week, then take 2 tablets twice daily (Patient not taking: Reported on 07/03/2018) 70 tablet 0   No  facility-administered medications prior to visit.     PAST MEDICAL HISTORY: Past Medical History:  Diagnosis Date  . Alzheimer disease ([place]) 11/08/2017  . Chronic cough   . Hypertension   . Memory difficulty 06/14/2016  . Pre-diabetes     PAST SURGICAL HISTORY: Past Surgical History:  Procedure Laterality Date  . BUNIONECTOMY  2016    FAMILY HISTORY: Family History  Problem Relation Age of Onset  . Asthma Maternal Uncle   . Emphysema Maternal Uncle   . Diabetes Mellitus II Father   . Renal Disease Brother   . Diabetes Mellitus II Brother   . Alzheimer's disease Mother     SOCIAL HISTORY: Social History   Socioeconomic History  . Marital status: Married    Spouse name: Not on file  . Number of children: 4  . Years of education: 2 Master's in business  . Highest education level: Not on file  Occupational History  . Occupation: Retired  Scientific laboratory technician  . Financial resource strain: Not on file  . Food insecurity:    Worry: Not on file    Inability: Not on file  . Transportation needs:    Medical: Not on file    Non-medical: Not on file  Tobacco Use  . Smoking status: Never Smoker  . Smokeless tobacco: Never Used  Substance and Sexual Activity  . Alcohol use: Yes    Alcohol/week: 0.0 standard drinks    Comment: 2 drinks per week  . Drug use: No  . Sexual activity: Not on file    Comment: Married  Lifestyle  . Physical activity:    Days per week: Not on file    Minutes per session: Not on file  . Stress: Not on file  Relationships  . Social connections:    Talks on phone: Not on file    Gets together: Not on file    Attends religious service: Not on file    Active member of club or organization: Not on file    Attends meetings of clubs or organizations: Not on file    Relationship status: Not on file  . Intimate partner violence:    Fear of current or ex partner: Not on file    Emotionally abused: Not on file    Physically abused: Not on file     Forced sexual activity: Not on file  Other Topics Concern  . Not on file  Social History Narrative   Lives at home w/ his wife, Belenda Cruise   Right-handed   Caffeine: 1 cup coffee and sometimes 1 soda per day      PHYSICAL EXAM  Vitals:   07/03/18 0944  BP: (!) 155/76  Pulse: 66  Weight: 203 lb 9.6 oz (92.4 kg)  Height: 5\' 11"  (1.803 m)  Body mass index is 28.4 kg/m.  MMSE - Mini Mental State Exam 07/03/2018 11/08/2017 06/19/2017  Orientation to time 0 0 1  Orientation to Place 0 1 2  Registration 3 0 1  Attention/ Calculation 0 0 0  Recall 0 0 2  Language- name 2 objects 2 2 2   Language- repeat 1 1 0  Language- follow 3 step command 3 2 3   Language- read & follow direction 1 1 1   Write a sentence 0 0 1  Copy design 1 1 1   Total score 11 8 14     Generalized: Well developed, in no acute distress  MMSE was 11/30 with 4 animals.  Able to perform some commands Neurological examination  Mentation: He is alert but is disoriented to time place and situation.  His speech is not logical. Cranial nerve II-XII: Pupils were equal round reactive to light. Extraocular movements were full, visual field were full on confrontational test. Facial sensation and strength were normal. Uvula tongue midline. Head turning and shoulder shrug  were normal and symmetric. Motor: The motor testing reveals 5 over 5 strength of all 4 extremities. Good symmetric motor tone is noted throughout.  Sensory: Sensory testing is intact to soft touch on all 4 extremities. No evidence of extinction is noted.  Coordination: Cerebellar testing reveals good finger-nose-finger Unable to perform heel-to-shin  Gait and station: Gait is normal. Tandem gait is mildly unsteady. Romberg is negative. No drift is seen.  Reflexes: Deep tendon reflexes are symmetric and normal bilaterally.   DIAGNOSTIC DATA (LABS, IMAGING, TESTING) - I reviewed patient records, labs, notes, testing and imaging myself where available.  No  results found for: WBC, HGB, HCT, MCV, PLT    Component Value Date/Time   NA 137 08/25/2015 1235   K 3.8 08/25/2015 1235   CL 102 08/25/2015 1235   CO2 25 08/25/2015 1235   GLUCOSE 133 (H) 08/25/2015 1235   BUN 16 08/25/2015 1235   CREATININE 0.94 08/25/2015 1235   CALCIUM 10.2 08/25/2015 1235   No results found for: CHOL, HDL, LDLCALC, LDLDIRECT, TRIG, CHOLHDL No results found for: HGBA1C Lab Results  Component Value Date   VITAMINB12 1,002 06/14/2016   No results found for: TSH    ASSESSMENT AND PLAN 68 y.o. year old male  has a past medical history of Alzheimer disease ([place]) (11/08/2017), Chronic cough, Hypertension, Memory difficulty (06/14/2016), and Pre-diabetes. here with:  1.  Progressive dementia, Alzheimer's disease  Overall his memory continues to decline and he continues to have difficulty with word finding.  He is no longer to able to carry on a logical conversation with his wife.  At today's visit he attempts to converse with me however his word choices do not make sense.  I conducted an MMSE today with a score of 11 out of 30.  His MMSE in July 2019 was 9 out of 30.  We discussed that Alzheimer's disease is a progressive disease.  We discussed that safety is a big concern for the patient as his disease process progresses.  His wife is currently working full-time leaving the patient by himself at home some days.  We discussed considering a memory care unit in the future.  I advised them to go ahead and start thinking about this.  I also provided his wife with a memory care packet provided by our office.  The packet contains community resources for memory patients and their families. We specifically discussed day programs for Mr. Folts.  His wife confirms  that she does need a caregiver support group.  She will consider options available for support groups in the community.  I sent in a refill for his Aricept.  We did discuss the Namenda and that he will try to start taking it  again.  However we did discuss that there is no magic cure for Alzheimer's disease and that the medications at best will delay progression over time.  I did discuss that the main side effect with Namenda is dizziness and that he should be careful to not fall.  Furthermore I mentioned that there is a combination pill of Aricept and Namenda available however it may be expensive.  At this time he will continue the Aricept and he will try to add the Namenda.  He will follow-up in 6 months or sooner if needed.  I advised he and his wife that if his symptoms worsen or if he develops any new symptoms they should let us know.   I spent 15 minutes with the patient. 50% of this time was spent discussing his plan of care and reviewing his memory score.   Butler Denmark, AGNP-C, DNP 07/03/2018, 10:47 AM [facility] [address] [place], [place] [address] [phone]

## 2018-07-03 ENCOUNTER — Encounter: Payer: Self-pay | Admitting: Neurology

## 2018-07-03 ENCOUNTER — Ambulatory Visit: Payer: Medicare HMO | Admitting: Neurology

## 2018-07-03 VITALS — BP 155/76 | HR 66 | Ht 71.0 in | Wt 203.6 lb

## 2018-07-03 DIAGNOSIS — F0280 Dementia in other diseases classified elsewhere without behavioral disturbance: Secondary | ICD-10-CM | POA: Diagnosis not present

## 2018-07-03 DIAGNOSIS — G300 Alzheimer's disease with early onset: Secondary | ICD-10-CM | POA: Diagnosis not present

## 2018-07-03 DIAGNOSIS — R69 Illness, unspecified: Secondary | ICD-10-CM | POA: Diagnosis not present

## 2018-07-03 MED ORDER — DONEPEZIL HCL 10 MG PO TABS: 10.0000 mg | ORAL_TABLET | Freq: Every day | ORAL | 2 refills | Status: DC

## 2018-07-03 NOTE — Progress Notes (Signed)
I have read the note, and I agree with the clinical assessment and plan.  Edwin Stewart   

## 2018-07-26 DIAGNOSIS — H25013 Cortical age-related cataract, bilateral: Secondary | ICD-10-CM | POA: Diagnosis not present

## 2018-07-26 DIAGNOSIS — H2513 Age-related nuclear cataract, bilateral: Secondary | ICD-10-CM | POA: Diagnosis not present

## 2018-07-26 DIAGNOSIS — H43393 Other vitreous opacities, bilateral: Secondary | ICD-10-CM | POA: Diagnosis not present

## 2018-07-26 DIAGNOSIS — H40013 Open angle with borderline findings, low risk, bilateral: Secondary | ICD-10-CM | POA: Diagnosis not present

## 2018-08-04 DIAGNOSIS — H52223 Regular astigmatism, bilateral: Secondary | ICD-10-CM | POA: Diagnosis not present

## 2018-08-04 DIAGNOSIS — H524 Presbyopia: Secondary | ICD-10-CM | POA: Diagnosis not present

## 2018-08-27 DIAGNOSIS — L97519 Non-pressure chronic ulcer of other part of right foot with unspecified severity: Secondary | ICD-10-CM | POA: Diagnosis not present

## 2018-08-27 DIAGNOSIS — I10 Essential (primary) hypertension: Secondary | ICD-10-CM | POA: Diagnosis not present

## 2018-08-27 DIAGNOSIS — I350 Nonrheumatic aortic (valve) stenosis: Secondary | ICD-10-CM | POA: Diagnosis not present

## 2018-08-27 DIAGNOSIS — J4520 Mild intermittent asthma, uncomplicated: Secondary | ICD-10-CM | POA: Diagnosis not present

## 2018-08-27 DIAGNOSIS — R194 Change in bowel habit: Secondary | ICD-10-CM | POA: Diagnosis not present

## 2018-08-27 DIAGNOSIS — E7800 Pure hypercholesterolemia, unspecified: Secondary | ICD-10-CM | POA: Diagnosis not present

## 2018-08-27 DIAGNOSIS — Z0001 Encounter for general adult medical examination with abnormal findings: Secondary | ICD-10-CM | POA: Diagnosis not present

## 2018-08-27 DIAGNOSIS — R69 Illness, unspecified: Secondary | ICD-10-CM | POA: Diagnosis not present

## 2018-08-27 DIAGNOSIS — E119 Type 2 diabetes mellitus without complications: Secondary | ICD-10-CM | POA: Diagnosis not present

## 2018-08-27 DIAGNOSIS — E039 Hypothyroidism, unspecified: Secondary | ICD-10-CM | POA: Diagnosis not present

## 2018-09-07 ENCOUNTER — Other Ambulatory Visit: Payer: Self-pay

## 2018-09-07 ENCOUNTER — Ambulatory Visit (INDEPENDENT_AMBULATORY_CARE_PROVIDER_SITE_OTHER): Payer: Medicare HMO

## 2018-09-07 ENCOUNTER — Ambulatory Visit: Payer: Medicare HMO | Admitting: Podiatry

## 2018-09-07 ENCOUNTER — Encounter: Payer: Self-pay | Admitting: Podiatry

## 2018-09-07 ENCOUNTER — Other Ambulatory Visit: Payer: Self-pay | Admitting: Podiatry

## 2018-09-07 VITALS — BP 143/85 | HR 66 | Temp 97.9°F | Resp 16

## 2018-09-07 DIAGNOSIS — M2041 Other hammer toe(s) (acquired), right foot: Secondary | ICD-10-CM | POA: Diagnosis not present

## 2018-09-07 DIAGNOSIS — M79674 Pain in right toe(s): Secondary | ICD-10-CM

## 2018-09-07 DIAGNOSIS — L97511 Non-pressure chronic ulcer of other part of right foot limited to breakdown of skin: Secondary | ICD-10-CM | POA: Diagnosis not present

## 2018-09-07 NOTE — Progress Notes (Signed)
  Subjective:  Patient ID: Edwin Stewart, male    DOB: 1950-06-06,  MRN: [id number]  Chief Complaint  Patient presents with  . Toe Pain    Right foot; 2nd toe-bruising on toe; swelling; pt's wife stated, "I am not sure how long this has been going on"; pt has Alzheimer disease; pt is pre-diabetic   68 y.o. male returns today for concern of the right second toe bruising. History as above.  Objective:   Vitals:   09/07/18 1207  BP: (!) 143/85  Pulse: 66  Resp: 16  Temp: 97.9 F (36.6 C)    General AA&O x3. Normal mood and affect.  Vascular Pedal pulses palpable.  Neurologic Epicritic sensation grossly intact.  Dermatologic Right second toe ulcer noted upon debriement measuring 0.2 x 0.2 superficial   Orthopedic: Semi-reducible hammertoe deformity right, 2nd toe Flexible hallux valgus deformity. Crossover hallux deformity.   Assessment & Plan:  Patient was evaluated and treated and all questions answered.  Hammertoe right second toe with ulcer -XR reviewed. Residual HAV s/p correction, hammertoes lesser digit. -Ulcer debrided as below -Flexor tenotomy as below. -Hallux spacer dispensed -Advised to remove the dressing in 24 hours and apply a band-aid and triple abx ointment every day thereafter.  Procedure: Selective Debridement of Wound Rationale: Removal of devitalized tissue from the wound to promote healing.  Pre-Debridement Wound Measurements: overlying hyperkeratosis  Post-Debridement Wound Measurements:  0,2 cm x 0.2 cm x 0.1 cm Type of Debridement: sharp selective Tissue Removed: Devitalized soft-tissue Dressing: Dry, sterile, compression dressing. Disposition: Patient tolerated procedure well. Patient to return in 1 week for follow-up.   Procedure: Flexor Tenotomy Indication for Procedure: toe with semi-reducible hammertoe with distal tip ulceration. Flexor tenotomy indicated to alleviate contracture, reduce pressure, and enhance healing of the ulceration.  Location: right, 2nd toe Anesthesia: Lidocaine 1% plain; 1.5 mL and Marcaine 0.5% plain; 1.5 mL digital block Instrumentation: 18 g needle  Technique: The toe was anesthetized as above and prepped in the usual fashion. The toe was exsanquinated and a tourniquet was secured at the base of the toe. An 18g needle was then used to percutaneously release the flexor tendon at the plantar surface of the toe with noted release of the hammertoe deformity. The incision was then dressed with antibiotic ointment and band-aid. Compression splint dressing applied. Patient tolerated the procedure well. Dressing: Dry, sterile, compression dressing. Disposition: Patient tolerated procedure well. Patient to return in 1 week for follow-up. Will benefit from repeat wound debridement until healed.   Return in about 2 weeks (around 09/21/2018) for Wound Care, Post-op, Right.

## 2018-09-17 ENCOUNTER — Other Ambulatory Visit: Payer: Self-pay | Admitting: Family Medicine

## 2018-09-17 DIAGNOSIS — R1084 Generalized abdominal pain: Secondary | ICD-10-CM

## 2018-09-20 ENCOUNTER — Ambulatory Visit (INDEPENDENT_AMBULATORY_CARE_PROVIDER_SITE_OTHER): Payer: Medicare HMO | Admitting: Podiatry

## 2018-09-20 ENCOUNTER — Encounter: Payer: Self-pay | Admitting: Podiatry

## 2018-09-20 ENCOUNTER — Other Ambulatory Visit: Payer: Self-pay

## 2018-09-20 VITALS — Temp 97.7°F

## 2018-09-20 DIAGNOSIS — L97511 Non-pressure chronic ulcer of other part of right foot limited to breakdown of skin: Secondary | ICD-10-CM | POA: Diagnosis not present

## 2018-09-20 NOTE — Progress Notes (Signed)
  Subjective:  Patient ID: Edwin Stewart, male    DOB: 1951-03-02,  MRN: [id number]  Chief Complaint  Patient presents with  . Hammer Toe    R-2nd toe ulcer check; "doing alright; about the same; no other concerns"   68 y.o. male returns today for wound check and follow-up of flexor tenotomy procedure.  History above confirmed with patient  Objective:   Vitals:   09/20/18 1458  Temp: 97.7 F (36.5 C)    General AA&O x3. Normal mood and affect.  Vascular Pedal pulses palpable.  Neurologic Epicritic sensation grossly intact.  Dermatologic Right second toe ulcer healed   Orthopedic:  Rectus right second toe Flexible hallux valgus deformity. Crossover hallux deformity.   Assessment & Plan:  Patient was evaluated and treated and all questions answered.  Hammertoe right second toe with ulcer -Toe rectus status post tenotomy.  Ulcer appears to be healed with slight hyperkeratosis but no open wound upon gentle debridement.  Advised to monitor for signs of recurrence  Return in about 1 month (around 10/20/2018) for Wound Care.

## 2018-09-21 ENCOUNTER — Encounter: Payer: Medicare HMO | Admitting: Podiatry

## 2018-09-24 ENCOUNTER — Other Ambulatory Visit: Payer: Self-pay

## 2018-09-24 ENCOUNTER — Ambulatory Visit
Admission: RE | Admit: 2018-09-24 | Discharge: 2018-09-24 | Disposition: A | Discharge status: Home / Self Care | Payer: Medicare HMO | Source: Ambulatory Visit | Attending: Family Medicine | Admitting: Family Medicine

## 2018-09-24 DIAGNOSIS — R195 Other fecal abnormalities: Secondary | ICD-10-CM | POA: Diagnosis not present

## 2018-09-24 DIAGNOSIS — R1084 Generalized abdominal pain: Secondary | ICD-10-CM

## 2018-09-24 DIAGNOSIS — R109 Unspecified abdominal pain: Secondary | ICD-10-CM | POA: Diagnosis not present

## 2018-09-24 MED ORDER — IOPAMIDOL (ISOVUE-300) INJECTION 61%
100.0000 mL | Freq: Once | INTRAVENOUS | Status: AC | PRN
  Administered 2018-09-24: 100 mL via INTRAVENOUS

## 2018-09-27 DIAGNOSIS — H40013 Open angle with borderline findings, low risk, bilateral: Secondary | ICD-10-CM | POA: Diagnosis not present

## 2018-10-01 ENCOUNTER — Other Ambulatory Visit: Payer: Self-pay

## 2018-10-01 ENCOUNTER — Encounter (HOSPITAL_COMMUNITY): Payer: Self-pay | Admitting: Emergency Medicine

## 2018-10-01 ENCOUNTER — Emergency Department (HOSPITAL_COMMUNITY)
Admission: EM | Admit: 2018-10-01 | Discharge: 2018-10-01 | Disposition: A | Discharge status: Home / Self Care | Payer: Medicare HMO | Attending: Emergency Medicine | Admitting: Emergency Medicine

## 2018-10-01 DIAGNOSIS — R51 Headache: Secondary | ICD-10-CM | POA: Diagnosis not present

## 2018-10-01 DIAGNOSIS — K6289 Other specified diseases of anus and rectum: Secondary | ICD-10-CM | POA: Diagnosis not present

## 2018-10-01 DIAGNOSIS — R195 Other fecal abnormalities: Secondary | ICD-10-CM

## 2018-10-01 DIAGNOSIS — G309 Alzheimer's disease, unspecified: Secondary | ICD-10-CM | POA: Diagnosis not present

## 2018-10-01 DIAGNOSIS — R197 Diarrhea, unspecified: Secondary | ICD-10-CM | POA: Diagnosis not present

## 2018-10-01 DIAGNOSIS — Z79899 Other long term (current) drug therapy: Secondary | ICD-10-CM | POA: Diagnosis not present

## 2018-10-01 DIAGNOSIS — I10 Essential (primary) hypertension: Secondary | ICD-10-CM | POA: Insufficient documentation

## 2018-10-01 DIAGNOSIS — R519 Headache, unspecified: Secondary | ICD-10-CM

## 2018-10-01 LAB — CBC WITH DIFFERENTIAL/PLATELET
Abs Immature Granulocytes: 0.01 10*3/uL (ref 0.00–0.07)
Basophils Absolute: 0 10*3/uL (ref 0.0–0.1)
Basophils Relative: 0 %
Eosinophils Absolute: 0.1 10*3/uL (ref 0.0–0.5)
Eosinophils Relative: 1 %
HCT: 44.9 % (ref 39.0–52.0)
Hemoglobin: 16.1 g/dL (ref 13.0–17.0)
Immature Granulocytes: 0 %
Lymphocytes Relative: 16 %
Lymphs Abs: 1.2 10*3/uL (ref 0.7–4.0)
MCH: 30.9 pg (ref 26.0–34.0)
MCHC: 35.9 g/dL (ref 30.0–36.0)
MCV: 86.2 fL (ref 80.0–100.0)
Monocytes Absolute: 0.7 10*3/uL (ref 0.1–1.0)
Monocytes Relative: 9 %
Neutro Abs: 5.5 10*3/uL (ref 1.7–7.7)
Neutrophils Relative %: 74 %
Platelets: 238 10*3/uL (ref 150–400)
RBC: 5.21 MIL/uL (ref 4.22–5.81)
RDW: 12.3 % (ref 11.5–15.5)
WBC: 7.5 10*3/uL (ref 4.0–10.5)
nRBC: 0 % (ref 0.0–0.2)

## 2018-10-01 LAB — COMPREHENSIVE METABOLIC PANEL
ALT: 22 U/L (ref 0–44)
AST: 22 U/L (ref 15–41)
Albumin: 4.3 g/dL (ref 3.5–5.0)
Alkaline Phosphatase: 58 U/L (ref 38–126)
Anion gap: 11 (ref 5–15)
BUN: 25 mg/dL — ABNORMAL HIGH (ref 8–23)
CO2: 24 mmol/L (ref 22–32)
Calcium: 9.7 mg/dL (ref 8.9–10.3)
Chloride: 103 mmol/L (ref 98–111)
Creatinine, Ser: 1.24 mg/dL (ref 0.61–1.24)
GFR calc Af Amer: >60 mL/min (ref >60)
GFR calc non Af Amer: 60 mL/min — ABNORMAL LOW (ref >60)
Glucose, Bld: 122 mg/dL — ABNORMAL HIGH (ref 70–99)
Potassium: 4.1 mmol/L (ref 3.5–5.1)
Sodium: 138 mmol/L (ref 135–145)
Total Bilirubin: 1.8 mg/dL — ABNORMAL HIGH (ref 0.3–1.2)
Total Protein: 7.1 g/dL (ref 6.5–8.1)

## 2018-10-01 LAB — URINALYSIS, ROUTINE W REFLEX MICROSCOPIC
Bacteria, UA: NONE SEEN
Bilirubin Urine: NEGATIVE
Glucose, UA: NEGATIVE mg/dL
Hgb urine dipstick: NEGATIVE
Ketones, ur: NEGATIVE mg/dL
Nitrite: NEGATIVE
Protein, ur: NEGATIVE mg/dL
Specific Gravity, Urine: 1.021 (ref 1.005–1.030)
pH: 5 (ref 5.0–8.0)

## 2018-10-01 LAB — LIPASE, BLOOD: Lipase: 50 U/L (ref 11–51)

## 2018-10-01 MED ORDER — BISMUTH SUBSALICYLATE 262 MG/15ML PO SUSP
30.0000 mL | Freq: Once | ORAL | Status: AC
  Administered 2018-10-01: 30 mL via ORAL
  Filled 2018-10-01: qty 236

## 2018-10-01 MED ORDER — FAMOTIDINE 20 MG PO TABS
20.0000 mg | ORAL_TABLET | Freq: Once | ORAL | Status: AC
  Administered 2018-10-01: 20 mg via ORAL
  Filled 2018-10-01: qty 1

## 2018-10-01 MED ORDER — ACETAMINOPHEN 325 MG PO TABS
650.0000 mg | ORAL_TABLET | Freq: Once | ORAL | Status: AC
  Administered 2018-10-01: 14:00:00 650 mg via ORAL
  Filled 2018-10-01: qty 2

## 2018-10-01 MED ORDER — DICYCLOMINE HCL 10 MG PO CAPS: 10.0000 mg | ORAL_CAPSULE | Freq: Three times a day (TID) | ORAL | 0 refills | Status: AC | PRN

## 2018-10-01 NOTE — ED Notes (Signed)
Patient and patient family verbalizes understanding of discharge instructions. Opportunity for questioning and answers were provided to both. Armband removed by staff, pt discharged from ED with family.

## 2018-10-01 NOTE — ED Triage Notes (Signed)
Pt arrives to ED from home with complaints of rectal pain and diarrhea for an unknown amount of time. Pt has hx of alzheimer's and is not a good historian. Pt has no other complaints. Will try to contact family for more information.

## 2018-10-01 NOTE — ED Provider Notes (Signed)
Edwin Stewart Provider Note   CSN: [id number] Arrival date & time: 10/01/18  6294    History   Chief Complaint Chief Complaint  Patient presents with  . Diarrhea  . Rectal Pain    HPI Edwin Stewart is a 68 y.o. male with a hx of Alzheimer's dementia.  There is a level 5 caveat and history is gathered via telephone call to the patient's wife,  Edwin Stewart.  According to her the patient has had about 3 weeks of loose stools.  He was seen by his primary care physician in April and had a stool culture sample sent for analysis which was negative.  He is not having any episodes of incontinence or true diarrhea.  His wife opted to stop his omeprazole as well as his iron which did not change his stools.  She coordinated with the patient's PCP who sent him for CT scan of the abdomen and pelvis on 09/24/2018 which showed no acute abnormalities, masses or obstructions, or inflammatory changes.  The patient's wife tried to set up a video evaluation with gastroenterology however they wanted to see him in person in June in the office.  She is frustrated.  She sent him here for further evaluation.  She does state that this morning he was complaining of pain in his rectum and a headache.  He has not taken anything to firm his stools.  He has had no recent foreign travel ingestion of suspicious foods or antibiotic use.  He has not had any nausea or vomiting.  She thinks that his appetite has been decreased.    HPI  Past Medical History:  Diagnosis Date  . Alzheimer disease ([place]) 11/08/2017  . Chronic cough   . Hypertension   . Memory difficulty 06/14/2016  . Pre-diabetes     Patient Active Problem List   Diagnosis Date Noted  . Alzheimer disease ([place]) 11/08/2017  . Memory difficulty 06/14/2016  . Multiple pulmonary nodules 09/07/2015  . Coarctation of thoracic aorta 09/07/2015  . Coronary atherosclerosis 09/07/2015  . Dyspnea and respiratory abnormality  08/25/2015  . Chronic cough 08/25/2015  . Hilar adenopathy 08/25/2015    Past Surgical History:  Procedure Laterality Date  . BUNIONECTOMY  2016        Home Medications    Prior to Admission medications   Medication Sig Start Date End Date Taking? Authorizing Provider  albuterol (PROAIR HFA) 108 (90 Base) MCG/ACT inhaler Inhale 1-2 puffs into the lungs every 6 (six) hours as needed for wheezing or shortness of breath.    [provider]  amLODipine (NORVASC) 2.5 MG tablet Take 2.5 mg by mouth daily. 06/19/18   [provider]  atorvastatin (LIPITOR) 10 MG tablet Take 10 mg by mouth daily. 06/21/18   [provider]  budesonide-formoterol (SYMBICORT) 80-4.5 MCG/ACT inhaler Inhale 2 puffs into the lungs as needed.     [provider]  dicyclomine (BENTYL) 10 MG capsule Take 1 capsule (10 mg total) by mouth 3 (three) times daily as needed for spasms. 10/01/18   Mynor Witkop, PA-C  donepezil (ARICEPT) 10 MG tablet Take 1 tablet (10 mg total) by mouth at bedtime. 07/03/18   Suzzanne Cloud, NP  levothyroxine (SYNTHROID, LEVOTHROID) 150 MCG tablet 150 mcg daily. 05/29/17   [provider]  memantine (NAMENDA) 5 MG tablet Take 1 tablet daily for one week, then take 1 tablet twice daily for one week, then take 1 tablet in the morning and  2 in the evening for one week, then take 2 tablets twice daily 11/08/17   Kathrynn Ducking, MD  Omeprazole (PRILOSEC PO) Take 1 tablet by mouth daily.    [provider]  valsartan-hydrochlorothiazide (DIOVAN-HCT) 320-25 MG tablet Take 1 tablet by mouth daily. 10/03/16   [provider]    Family History Family History  Problem Relation Age of Onset  . Asthma Maternal Uncle   . Emphysema Maternal Uncle   . Diabetes Mellitus II Father   . Renal Disease Brother   . Diabetes Mellitus II Brother   . Alzheimer's disease Mother     Social History Social History   Tobacco Use  . Smoking status:  Never Smoker  . Smokeless tobacco: Never Used  Substance Use Topics  . Alcohol use: Yes    Alcohol/week: 0.0 standard drinks    Comment: 2 drinks per week  . Drug use: No     Allergies   Patient has no known allergies.   Review of Systems Review of Systems  Ten systems reviewed and are negative for acute change, except as noted in the HPI.   Physical Exam Updated Vital Signs BP (!) 129/54   Pulse 73   Temp 98.3 F (36.8 C) (Oral)   Resp 15   SpO2 98%   Physical Exam Vitals signs and nursing note reviewed.  Constitutional:      General: He is not in acute distress.    Appearance: He is well-developed. He is not diaphoretic.     Comments: Pleasantly demented elderly male in no acute distress  HENT:     Head: Normocephalic and atraumatic.  Eyes:     General: No scleral icterus.    Conjunctiva/sclera: Conjunctivae normal.  Neck:     Musculoskeletal: Normal range of motion and neck supple.  Cardiovascular:     Rate and Rhythm: Normal rate and regular rhythm.     Heart sounds: Normal heart sounds.  Pulmonary:     Effort: Pulmonary effort is normal. No respiratory distress.     Breath sounds: Normal breath sounds.  Abdominal:     Palpations: Abdomen is soft.     Tenderness: There is no abdominal tenderness.  Genitourinary:    Comments: Digital Rectal Exam reveals sphincter with good tone. No external hemorrhoids. No masses or fissures. Stool color is brown with no overt blood.  Tenderness during examination Skin:    General: Skin is warm and dry.  Neurological:     Mental Status: He is alert.  Psychiatric:        Behavior: Behavior normal.      ED Treatments / Results  Labs (all labs ordered are listed, but only abnormal results are displayed) Labs Reviewed  COMPREHENSIVE METABOLIC PANEL - Abnormal; Notable for the following components:      Result Value   Glucose, Bld 122 (*)    BUN 25 (*)    Total Bilirubin 1.8 (*)    GFR calc non Af Amer 60 (*)     All other components within normal limits  URINALYSIS, ROUTINE W REFLEX MICROSCOPIC - Abnormal; Notable for the following components:   Leukocytes,Ua TRACE (*)    All other components within normal limits  CBC WITH DIFFERENTIAL/PLATELET  LIPASE, BLOOD    EKG None  Radiology No results found.  Procedures Procedures (including critical care time)  Medications Ordered in ED Medications  bismuth subsalicylate (PEPTO BISMOL) 262 MG/15ML suspension 30 mL (30 mLs Oral Given 10/01/18 1333)  famotidine (  PEPCID) tablet 20 mg (20 mg Oral Given 10/01/18 1333)  acetaminophen (TYLENOL) tablet 650 mg (650 mg Oral Given 10/01/18 1333)     Initial Impression / Assessment and Plan / ED Course  I have reviewed the triage vital signs and the nursing notes.  Pertinent labs & imaging results that were available during my care of the patient were reviewed by me and considered in my medical decision making (see chart for details).  Clinical Course as of Oct 01 1547  Mon Oct 01, 2018  1022 Spoke with Patient'sWife Valerie Roys. Discussed history and ED plan of care.    [AH]  2935 68 year old male with history of dementia here with 3 weeks of diarrhea.  No report of blood.  His abdomen is soft without masses guarding or rebound.  He is got some screening labs that are unremarkable.  Plan is to discharge home with some symptomatic relief.   [MB]    Clinical Course User Index [AH] Margarita Mail, PA-C [MB] Hayden Rasmussen, MD     68 year old male here for loose stools, complaint of headache and rectal pain.  He has dementia and does not make any note of headache here in the emergency Stewart.  He has a benign abdominal and rectal examination here in the emergency Stewart.  He has had no active loose stools, vomiting, or diarrhea.  No evidence of fecal impaction.  Patient's urine is without any evidence of infection.  CBC is normal, no elevated lipase.  Glucose is only slightly elevated with a  slight bump in the patient's total bili.  No other metabolic abnormalities.  Patient was seen and shared visit with Dr. Melina Copa.  Patient will be given symptomatic care with oral Pepto-Bismol, Tylenol.  Will add Bentyl at discharge for potential colonic spasms.  Do not feel that BNO suppositories are good option.  Unfortunately the history is limited due to the patient's dementia and poor communication.  I have discussed the case during intake and at discharge with the patient's wife.  She is understandably frustrated with not knowing what the patient's pain is caused from however do not feel that he has any life limb or organ threatening cause of his pain today.  He had a recent CT scan which showed no acute abnormalities.  He is afebrile, no leukocytosis and hemodynamically stable with benign examination.  Patient will be discharged home to follow-up with PCP and GI services.    Final Clinical Impressions(s) / ED Diagnoses   Final diagnoses:  Loose stools  Mild headache  Rectal discomfort    ED Discharge Orders         Ordered    dicyclomine (BENTYL) 10 MG capsule  3 times daily PRN     10/01/18 1315           Margarita Mail, PA-C 10/01/18 1554    Hayden Rasmussen, MD 10/01/18 1902

## 2018-10-01 NOTE — Discharge Instructions (Addendum)
Use over the counter pepto bismol or your pack take as directed until stools have slowed and are more firm.  You may also add Metamucil which both provides bulk to the stool and may firm stools along with promoting regular defecation.  I have prescribed a medication called Bentyl which may decrease some of the colon or intestines.  Please follow closely with GI specialist or his primary care doctor.  Return for the reasons I discussed with you over the phone.

## 2018-10-12 DIAGNOSIS — R140 Abdominal distension (gaseous): Secondary | ICD-10-CM | POA: Diagnosis not present

## 2018-10-12 DIAGNOSIS — R634 Abnormal weight loss: Secondary | ICD-10-CM | POA: Diagnosis not present

## 2018-10-12 DIAGNOSIS — R194 Change in bowel habit: Secondary | ICD-10-CM | POA: Diagnosis not present

## 2018-10-12 DIAGNOSIS — K219 Gastro-esophageal reflux disease without esophagitis: Secondary | ICD-10-CM | POA: Diagnosis not present

## 2018-10-12 DIAGNOSIS — Z86010 Personal history of colonic polyps: Secondary | ICD-10-CM | POA: Diagnosis not present

## 2018-10-12 DIAGNOSIS — R69 Illness, unspecified: Secondary | ICD-10-CM | POA: Diagnosis not present

## 2018-10-16 DIAGNOSIS — R634 Abnormal weight loss: Secondary | ICD-10-CM | POA: Diagnosis not present

## 2018-10-16 DIAGNOSIS — R194 Change in bowel habit: Secondary | ICD-10-CM | POA: Diagnosis not present

## 2018-10-16 DIAGNOSIS — K2930 Chronic superficial gastritis without bleeding: Secondary | ICD-10-CM | POA: Diagnosis not present

## 2018-10-16 DIAGNOSIS — K648 Other hemorrhoids: Secondary | ICD-10-CM | POA: Diagnosis not present

## 2018-10-16 DIAGNOSIS — R12 Heartburn: Secondary | ICD-10-CM | POA: Diagnosis not present

## 2018-10-16 DIAGNOSIS — K5730 Diverticulosis of large intestine without perforation or abscess without bleeding: Secondary | ICD-10-CM | POA: Diagnosis not present

## 2018-10-16 DIAGNOSIS — D122 Benign neoplasm of ascending colon: Secondary | ICD-10-CM | POA: Diagnosis not present

## 2018-10-17 ENCOUNTER — Other Ambulatory Visit: Payer: Self-pay | Admitting: Family Medicine

## 2018-10-17 ENCOUNTER — Other Ambulatory Visit (HOSPITAL_COMMUNITY): Payer: Self-pay | Admitting: Family Medicine

## 2018-10-17 DIAGNOSIS — I350 Nonrheumatic aortic (valve) stenosis: Secondary | ICD-10-CM

## 2018-10-22 DIAGNOSIS — D122 Benign neoplasm of ascending colon: Secondary | ICD-10-CM | POA: Diagnosis not present

## 2018-10-22 DIAGNOSIS — K2930 Chronic superficial gastritis without bleeding: Secondary | ICD-10-CM | POA: Diagnosis not present

## 2018-10-25 ENCOUNTER — Ambulatory Visit: Payer: Medicare HMO | Admitting: Podiatry

## 2018-11-21 ENCOUNTER — Encounter (HOSPITAL_COMMUNITY): Payer: Self-pay | Admitting: Family Medicine

## 2019-01-07 ENCOUNTER — Ambulatory Visit: Payer: Medicare HMO | Admitting: Neurology

## 2019-01-13 NOTE — Progress Notes (Signed)
PATIENT: QUASHAUN KLIMCZAK DOB: May 21, 1951  REASON FOR VISIT: follow up HISTORY FROM: patient  HISTORY OF PRESENT ILLNESS: Today 01/14/19  Mr. Reina is a 68 year old male with history of progressive memory disturbance consistent with Alzheimer's disease. He was initially evaluated in 2018 by Dr. Jannifer Franklin.  MRI of the brain at that time was consistent with Alzheimer's disease.  He has had a progressive disease process.  He is a retired Engineer, maintenance (IT).  He lives at home with his wife.  His wife still works full-time.  They have a companion who comes twice a week for 5 hours a day.  Next week, he will be starting an adult day program from 8-5, 2 days a week, his companion will come 3 days a week.  There have not been any issues with him wandering.  His wife leaves him food for him to eat.  He does not do any cooking.  He has a great appetite. He has quite a sweet tooth.  He has not had any falls.  His wife does feel like with his walking he is shuffling his feet more.  He is no longer taking Aricept or Namenda.  He sleeps well at night, but he has to get up several times during the night to use the bathroom.  He is taking trazodone.  He is seen his primary care doctor, has gotten good report.  There is an issue with hygiene, his wife has a difficult time getting him to take a shower or wash his hair.  He is still continent of urine, may have an occasional incontinence episode.  He continues to have difficulty with word finding.  At this time, there is no episodes of agitation.  He presents today for follow-up accompanied by his wife.  HISTORY Mr. Weyer is a 68 year old male with a history of progressive memory disturbance consistent with Alzheimer's Disease. He was first evaluated for his memory in January 2018 by Dr. Jannifer Franklin. At that time he underwent an MRI and blood testing. The MRI was consistent with Alzheimer's Disease. The disease process has had a significant progression over the last 18 months. He is a  retired Engineer, maintenance (IT) and is no longer driving.  He currently lives at home with his wife.  He does have a companion who comes 2 days a week to spend time with him.  His companion takes him to lunch and run errands.  His wife currently works full-time and they have limited family support nearby.  He reports that he has a good appetite.  However he is unable to make meals for himself.  His wife used to leave sandwich supplies at home but he is no longer able to make his own sandwich and she is having prepare his lunch.  He has not had any falls.  However his gait is sometimes unsteady.  He does not use a cane or walker.  She reports that he is sleeping at night however he does wake up periodically.  He does stay home by himself some days during the week while his wife is working.  She denies any safety concerns.  She reports that he does clean the sink almost obsessively.  She denies any episodes of wandering.  He is taking the Aricept 10 mg at bedtime however he is not taking the Namenda.  He only took the Namenda once and then he decided he did not want to take it anymore.  He continues to have difficulty with word finding and he will  carry on conversations however his words are not logical or rational.  His wife reports it is difficult because she is no longer able to carry on a conversation with him.  He presents for follow-up.  HISTORY (11/08/2017 Dr. Jannifer Franklin) Mr.Slaskais a 68 year old right-handed white male with a history of a progressive memory disturbance consistent with Alzheimer's disease. The patient has had significant progression over the last 12 months. He remains on Aricept 10 mg at night. He is tolerating this well. He is having increasing problems with word finding, at times his speech is nonsensical. The patient no longer operates a motor vehicle. He is able to bathe and dress himself. He has no problems with agitation. He at times may have difficulty sleeping at night, if he wakes up in the  middle the night he has difficulty getting back to sleep. The patient returns for an evaluation.  REVIEW OF SYSTEMS: Out of a complete 14 system review of symptoms, the patient complains only of the following symptoms, and all other reviewed systems are negative.  Memory loss, headache, speech difficulty, weakness  ALLERGIES: No Known Allergies  HOME MEDICATIONS: Outpatient Medications Prior to Visit  Medication Sig Dispense Refill   albuterol (PROAIR HFA) 108 (90 Base) MCG/ACT inhaler Inhale 1-2 puffs into the lungs every 6 (six) hours as needed for wheezing or shortness of breath.     amLODipine (NORVASC) 2.5 MG tablet Take 2.5 mg by mouth daily.     atorvastatin (LIPITOR) 10 MG tablet Take 10 mg by mouth daily.     budesonide-formoterol (SYMBICORT) 80-4.5 MCG/ACT inhaler Inhale 2 puffs into the lungs as needed.      levothyroxine (SYNTHROID) 125 MCG tablet Take 125 mcg by mouth daily.     Omeprazole (PRILOSEC PO) Take 1 tablet by mouth daily.     traZODone (DESYREL) 50 MG tablet Take 50 mg by mouth as needed.     valsartan-hydrochlorothiazide (DIOVAN-HCT) 320-25 MG tablet Take 1 tablet by mouth daily.  1   levothyroxine (SYNTHROID, LEVOTHROID) 150 MCG tablet 150 mcg daily.  1   dicyclomine (BENTYL) 10 MG capsule Take 1 capsule (10 mg total) by mouth 3 (three) times daily as needed for spasms. (Patient not taking: Reported on 01/14/2019) 30 capsule 0   donepezil (ARICEPT) 10 MG tablet Take 1 tablet (10 mg total) by mouth at bedtime. (Patient not taking: Reported on 01/14/2019) 90 tablet 2   memantine (NAMENDA) 5 MG tablet Take 1 tablet daily for one week, then take 1 tablet twice daily for one week, then take 1 tablet in the morning and 2 in the evening for one week, then take 2 tablets twice daily (Patient not taking: Reported on 01/14/2019) 70 tablet 0   No facility-administered medications prior to visit.     PAST MEDICAL HISTORY: Past Medical History:  Diagnosis Date    Alzheimer disease ([place]) 11/08/2017   Chronic cough    Hypertension    Memory difficulty 06/14/2016   Pre-diabetes     PAST SURGICAL HISTORY: Past Surgical History:  Procedure Laterality Date   BUNIONECTOMY  2016    FAMILY HISTORY: Family History  Problem Relation Age of Onset   Asthma Maternal Uncle    Emphysema Maternal Uncle    Diabetes Mellitus II Father    Renal Disease Brother    Diabetes Mellitus II Brother    Alzheimer's disease Mother     SOCIAL HISTORY: Social History   Socioeconomic History   Marital status: Married    Spouse  name: Not on file   Number of children: 4   Years of education: 2 Master's in business   Highest education level: Not on file  Occupational History   Occupation: Retired  Scientist, product/process development strain: Not on file   Food insecurity    Worry: Not on file    Inability: Not on Lexicographer needs    Medical: Not on file    Non-medical: Not on file  Tobacco Use   Smoking status: Never Smoker   Smokeless tobacco: Never Used  Substance and Sexual Activity   Alcohol use: Yes    Alcohol/week: 0.0 standard drinks    Comment: 2 drinks per week   Drug use: No   Sexual activity: Not on file    Comment: Married  Lifestyle   Physical activity    Days per week: Not on file    Minutes per session: Not on file   Stress: Not on file  Relationships   Social connections    Talks on phone: Not on file    Gets together: Not on file    Attends religious service: Not on file    Active member of club or organization: Not on file    Attends meetings of clubs or organizations: Not on file    Relationship status: Not on file   Intimate partner violence    Fear of current or ex partner: Not on file    Emotionally abused: Not on file    Physically abused: Not on file    Forced sexual activity: Not on file  Other Topics Concern   Not on file  Social History Narrative   Lives at home w/ his wife,  Belenda Cruise   Right-handed   Caffeine: 1 cup coffee and sometimes 1 soda per day    PHYSICAL EXAM  Vitals:   01/14/19 0757  BP: (!) 144/76  Pulse: 66  Temp: 97.8 F (36.6 C)  TempSrc: Oral  Weight: 207 lb 9.6 oz (94.2 kg)  Height: 5\' 11"  (1.803 m)   Body mass index is 28.95 kg/m.  Generalized: Well developed, in no acute distress, is well groomed  Neurological examination  Mentation: Alert, history provided by his wife, unable to complete MMSE, speech is fragmented, expressive aphasia, difficulty following exam commands Cranial nerve II-XII: Pupils were equal round reactive to light. Extraocular movements were full, visual field were full on confrontational test. Facial sensation and strength were normal. Head turning and shoulder shrug  were normal and symmetric. Motor: The motor testing reveals 5 over 5 strength of all 4 extremities. Good symmetric motor tone is noted throughout.  Sensory: Sensory testing is intact to soft touch on all 4 extremities. No evidence of extinction is noted.  Coordination: Unable to perform finger-nose-finger, or heel-to-shin  Gait and station: Gait is slightly wide-based, moderate paced Reflexes: Deep tendon reflexes are symmetric  DIAGNOSTIC DATA (LABS, IMAGING, TESTING) - I reviewed patient records, labs, notes, testing and imaging myself where available.  Lab Results  Component Value Date   WBC 7.5 10/01/2018   HGB 16.1 10/01/2018   HCT 44.9 10/01/2018   MCV 86.2 10/01/2018   PLT 238 10/01/2018      Component Value Date/Time   NA 138 10/01/2018 1041   K 4.1 10/01/2018 1041   CL 103 10/01/2018 1041   CO2 24 10/01/2018 1041   GLUCOSE 122 (H) 10/01/2018 1041   BUN 25 (H) 10/01/2018 1041   CREATININE 1.24 10/01/2018 1041  CALCIUM 9.7 10/01/2018 1041   PROT 7.1 10/01/2018 1041   ALBUMIN 4.3 10/01/2018 1041   AST 22 10/01/2018 1041   ALT 22 10/01/2018 1041   ALKPHOS 58 10/01/2018 1041   BILITOT 1.8 (H) 10/01/2018 1041   GFRNONAA 60  (L) 10/01/2018 1041   GFRAA >60 10/01/2018 1041   No results found for: CHOL, HDL, LDLCALC, LDLDIRECT, TRIG, CHOLHDL No results found for: HGBA1C Lab Results  Component Value Date   VITAMINB12 1,002 06/14/2016   No results found for: TSH  ASSESSMENT AND PLAN 68 y.o. year old male  has a past medical history of Alzheimer disease ([place]) (11/08/2017), Chronic cough, Hypertension, Memory difficulty (06/14/2016), and Pre-diabetes. here with:  1.  Progressive dementia, Alzheimer's disease  He was unable to complete the MMSE today.  His memory has continued to decline and he has difficulty with word finding.  He is no longer taking Aricept or Namenda.  Starting next week, he will be attending an adult day program 2 days a week, will continue to have a companion coming to the home 3 days a week. We have discussed safety as the disease progresses. Since last visit he has gained 4 lbs.  He will follow-up in 6 months or sooner if needed.  He will continue close follow-up with his primary doctor. They will call or any problems or concerns.    I spent 15 minutes with the patient. 50% of this time was spent discussing his plan of care.   Butler Denmark, AGNP-C, DNP 01/14/2019, 8:10 AM [facility] [address], [place], [place] [address] [phone]

## 2019-01-14 ENCOUNTER — Other Ambulatory Visit: Payer: Self-pay

## 2019-01-14 ENCOUNTER — Ambulatory Visit: Payer: Medicare HMO | Admitting: Neurology

## 2019-01-14 ENCOUNTER — Encounter: Payer: Self-pay | Admitting: Neurology

## 2019-01-14 VITALS — BP 144/76 | HR 66 | Temp 97.8°F | Ht 71.0 in | Wt 207.6 lb

## 2019-01-14 DIAGNOSIS — F0280 Dementia in other diseases classified elsewhere without behavioral disturbance: Secondary | ICD-10-CM | POA: Diagnosis not present

## 2019-01-14 DIAGNOSIS — G300 Alzheimer's disease with early onset: Secondary | ICD-10-CM | POA: Diagnosis not present

## 2019-01-14 DIAGNOSIS — R69 Illness, unspecified: Secondary | ICD-10-CM | POA: Diagnosis not present

## 2019-01-14 NOTE — Progress Notes (Signed)
I have read the note, and I agree with the clinical assessment and plan.  Charles K Willis   

## 2019-01-26 DIAGNOSIS — Z20828 Contact with and (suspected) exposure to other viral communicable diseases: Secondary | ICD-10-CM | POA: Diagnosis not present

## 2019-03-04 DIAGNOSIS — I350 Nonrheumatic aortic (valve) stenosis: Secondary | ICD-10-CM | POA: Diagnosis not present

## 2019-03-04 DIAGNOSIS — E119 Type 2 diabetes mellitus without complications: Secondary | ICD-10-CM | POA: Diagnosis not present

## 2019-03-04 DIAGNOSIS — E7800 Pure hypercholesterolemia, unspecified: Secondary | ICD-10-CM | POA: Diagnosis not present

## 2019-03-04 DIAGNOSIS — E039 Hypothyroidism, unspecified: Secondary | ICD-10-CM | POA: Diagnosis not present

## 2019-03-04 DIAGNOSIS — R69 Illness, unspecified: Secondary | ICD-10-CM | POA: Diagnosis not present

## 2019-03-04 DIAGNOSIS — Z79899 Other long term (current) drug therapy: Secondary | ICD-10-CM | POA: Diagnosis not present

## 2019-03-04 DIAGNOSIS — Z23 Encounter for immunization: Secondary | ICD-10-CM | POA: Diagnosis not present

## 2019-03-04 DIAGNOSIS — I10 Essential (primary) hypertension: Secondary | ICD-10-CM | POA: Diagnosis not present

## 2019-03-22 ENCOUNTER — Ambulatory Visit: Payer: Medicare HMO | Admitting: Podiatry

## 2019-06-07 DIAGNOSIS — D649 Anemia, unspecified: Secondary | ICD-10-CM | POA: Diagnosis not present

## 2019-06-07 DIAGNOSIS — A0472 Enterocolitis due to Clostridium difficile, not specified as recurrent: Secondary | ICD-10-CM | POA: Diagnosis not present

## 2019-06-07 DIAGNOSIS — S098XXA Other specified injuries of head, initial encounter: Secondary | ICD-10-CM | POA: Diagnosis not present

## 2019-06-07 DIAGNOSIS — R58 Hemorrhage, not elsewhere classified: Secondary | ICD-10-CM | POA: Diagnosis not present

## 2019-06-07 DIAGNOSIS — W1830XA Fall on same level, unspecified, initial encounter: Secondary | ICD-10-CM | POA: Diagnosis not present

## 2019-06-07 DIAGNOSIS — Y9301 Activity, walking, marching and hiking: Secondary | ICD-10-CM | POA: Diagnosis not present

## 2019-06-07 DIAGNOSIS — S0081XA Abrasion of other part of head, initial encounter: Secondary | ICD-10-CM | POA: Diagnosis not present

## 2019-06-07 DIAGNOSIS — Y92129 Unspecified place in nursing home as the place of occurrence of the external cause: Secondary | ICD-10-CM | POA: Diagnosis not present

## 2019-06-07 DIAGNOSIS — I10 Essential (primary) hypertension: Secondary | ICD-10-CM | POA: Diagnosis not present

## 2019-06-07 DIAGNOSIS — W19XXXA Unspecified fall, initial encounter: Secondary | ICD-10-CM | POA: Diagnosis not present

## 2019-06-07 DIAGNOSIS — Z9181 History of falling: Secondary | ICD-10-CM | POA: Diagnosis not present

## 2019-06-07 DIAGNOSIS — F0390 Unspecified dementia without behavioral disturbance: Secondary | ICD-10-CM | POA: Diagnosis not present

## 2019-06-07 DIAGNOSIS — R296 Repeated falls: Secondary | ICD-10-CM | POA: Diagnosis not present

## 2019-06-07 DIAGNOSIS — R4182 Altered mental status, unspecified: Secondary | ICD-10-CM | POA: Diagnosis not present

## 2019-06-07 DIAGNOSIS — Z7401 Bed confinement status: Secondary | ICD-10-CM | POA: Diagnosis not present

## 2019-06-07 DIAGNOSIS — R69 Illness, unspecified: Secondary | ICD-10-CM | POA: Diagnosis not present

## 2019-06-22 DIAGNOSIS — M50322 Other cervical disc degeneration at C5-C6 level: Secondary | ICD-10-CM | POA: Diagnosis not present

## 2019-06-22 DIAGNOSIS — W1830XA Fall on same level, unspecified, initial encounter: Secondary | ICD-10-CM | POA: Diagnosis not present

## 2019-06-22 DIAGNOSIS — Z7401 Bed confinement status: Secondary | ICD-10-CM | POA: Diagnosis not present

## 2019-06-22 DIAGNOSIS — R69 Illness, unspecified: Secondary | ICD-10-CM | POA: Diagnosis not present

## 2019-06-22 DIAGNOSIS — W0110XA Fall on same level from slipping, tripping and stumbling with subsequent striking against unspecified object, initial encounter: Secondary | ICD-10-CM | POA: Diagnosis not present

## 2019-06-22 DIAGNOSIS — R9082 White matter disease, unspecified: Secondary | ICD-10-CM | POA: Diagnosis not present

## 2019-06-22 DIAGNOSIS — R296 Repeated falls: Secondary | ICD-10-CM | POA: Diagnosis not present

## 2019-06-22 DIAGNOSIS — E7800 Pure hypercholesterolemia, unspecified: Secondary | ICD-10-CM | POA: Diagnosis not present

## 2019-06-22 DIAGNOSIS — W19XXXA Unspecified fall, initial encounter: Secondary | ICD-10-CM | POA: Diagnosis not present

## 2019-06-22 DIAGNOSIS — S0081XA Abrasion of other part of head, initial encounter: Secondary | ICD-10-CM | POA: Diagnosis not present

## 2019-06-22 DIAGNOSIS — I10 Essential (primary) hypertension: Secondary | ICD-10-CM | POA: Diagnosis not present

## 2019-06-22 DIAGNOSIS — R519 Headache, unspecified: Secondary | ICD-10-CM | POA: Diagnosis not present

## 2019-07-01 DIAGNOSIS — E860 Dehydration: Secondary | ICD-10-CM | POA: Diagnosis not present

## 2019-07-01 DIAGNOSIS — R4182 Altered mental status, unspecified: Secondary | ICD-10-CM | POA: Diagnosis not present

## 2019-07-01 DIAGNOSIS — I10 Essential (primary) hypertension: Secondary | ICD-10-CM | POA: Diagnosis not present

## 2019-07-01 DIAGNOSIS — R69 Illness, unspecified: Secondary | ICD-10-CM | POA: Diagnosis not present

## 2019-07-01 DIAGNOSIS — S0081XA Abrasion of other part of head, initial encounter: Secondary | ICD-10-CM | POA: Diagnosis not present

## 2019-07-01 DIAGNOSIS — E785 Hyperlipidemia, unspecified: Secondary | ICD-10-CM | POA: Diagnosis not present

## 2019-07-21 ENCOUNTER — Encounter: Payer: Self-pay | Admitting: Neurology

## 2019-07-22 ENCOUNTER — Ambulatory Visit: Payer: Medicare HMO | Admitting: Neurology

## 2019-08-22 DEATH — deceased

## 2021-04-08 IMAGING — CT CT ABDOMEN AND PELVIS WITH CONTRAST
1 of 3 series · 13 of 32 positions shown, 19 images · IV contrast (APPLIED)
Comparison: None.

CLINICAL DATA: New onset abdominal pain, moderate to severe.

EXAM:
CT ABDOMEN AND PELVIS WITH CONTRAST
TECHNIQUE: Multidetector CT imaging of the abdomen and pelvis was performed
using the standard protocol following bolus administration of
intravenous contrast.
CONTRAST:  100mL PRQCI9-VLL IOPAMIDOL (PRQCI9-VLL) INJECTION 61%

[Series 2: abd/pelvis w/cm · axial · 0.84mm/px · z∈[-476,-86]mm · 13 of 92 slices shown, 19 images]
[im 7/92  soft-tissue]
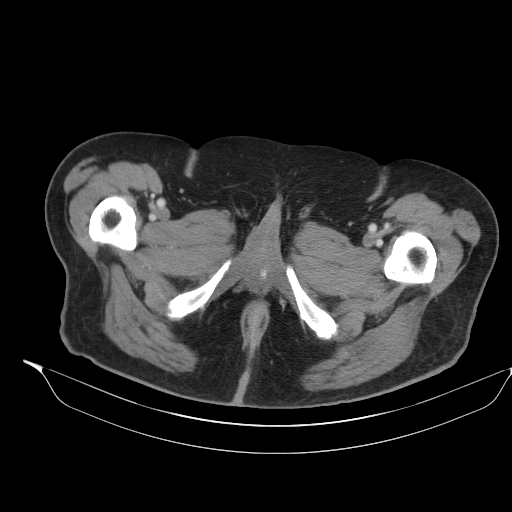
[im 7/92  bone]
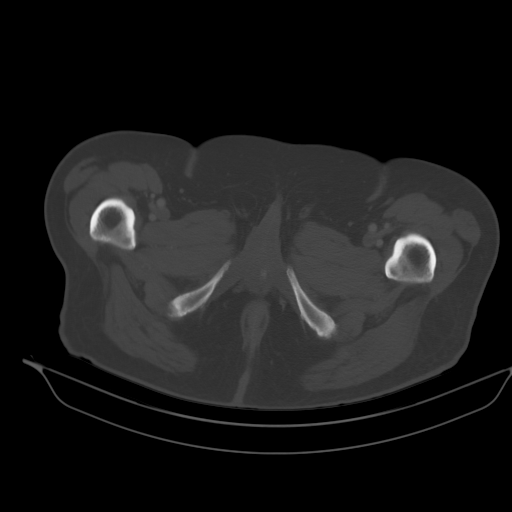
[im 13/92  soft-tissue]
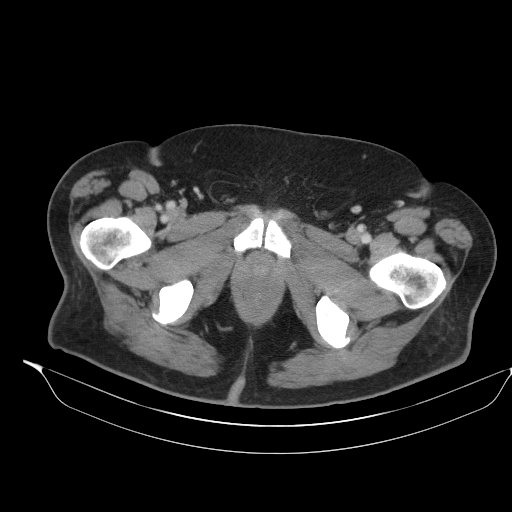
[im 19/92  soft-tissue]
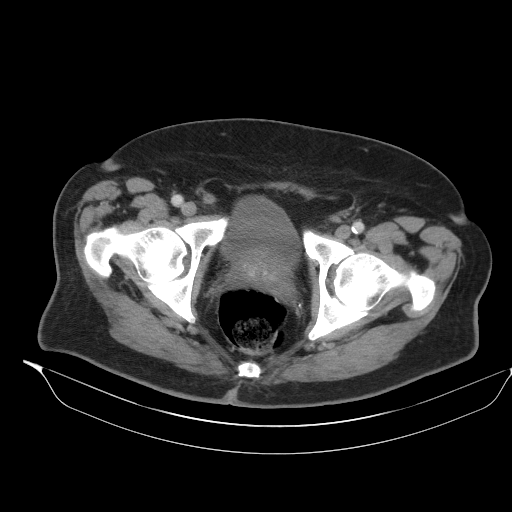
[im 25/92  soft-tissue]
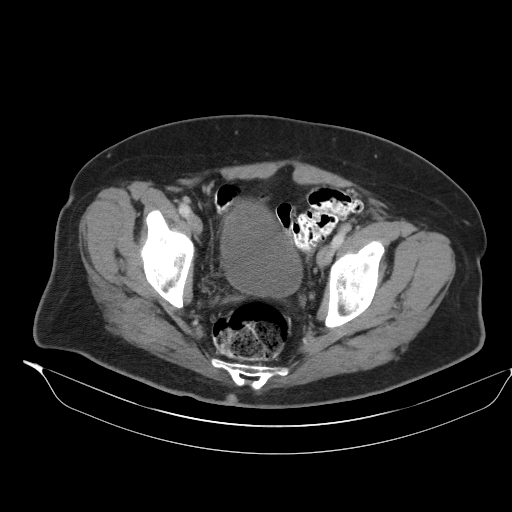
[im 31/92  soft-tissue]
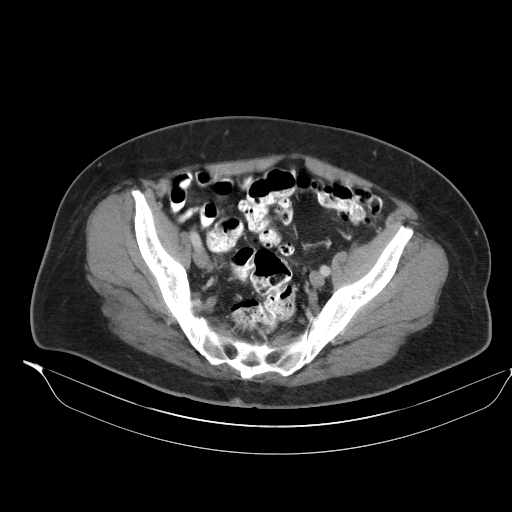
[im 37/92  soft-tissue]
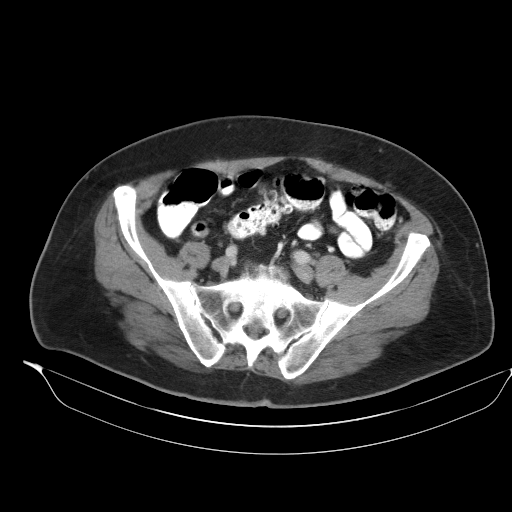
[im 49/92  soft-tissue]
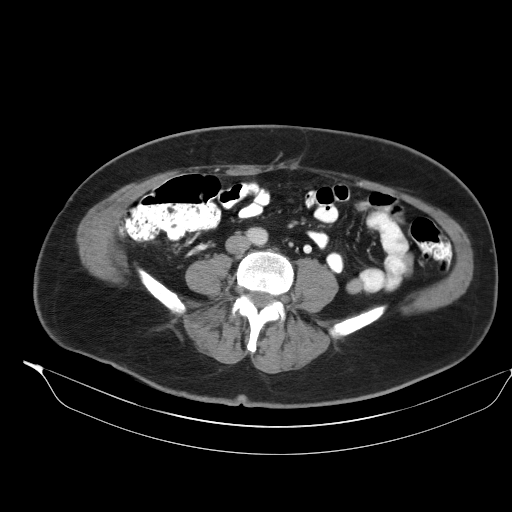
[im 55/92  soft-tissue]
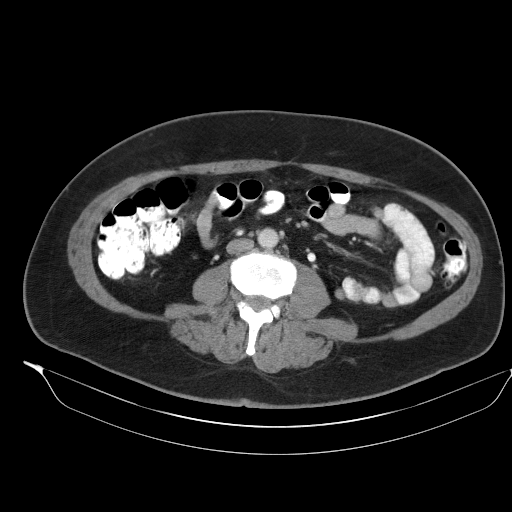
[im 61/92  soft-tissue]
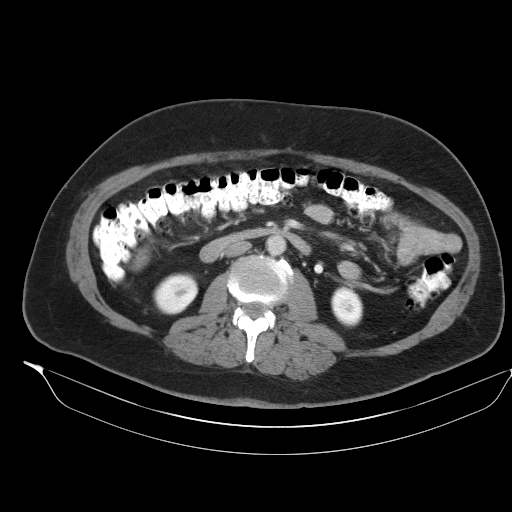
[im 61/92  bone]
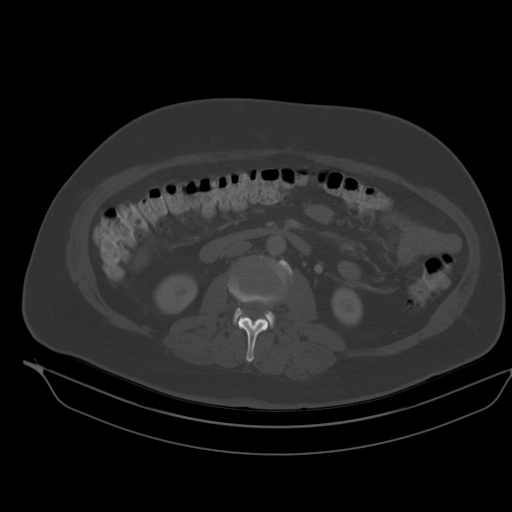
[im 67/92  soft-tissue]
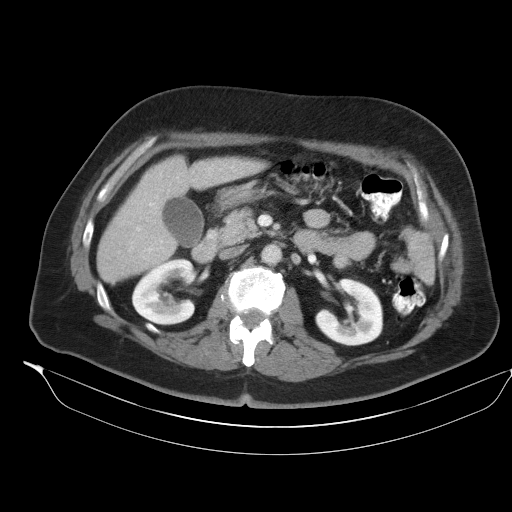
[im 67/92  lung]
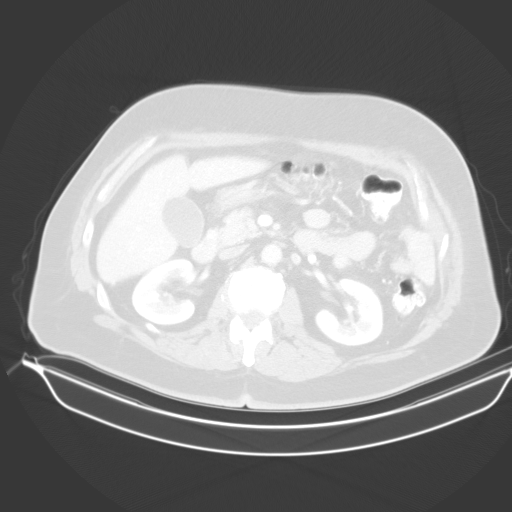
[im 73/92  soft-tissue]
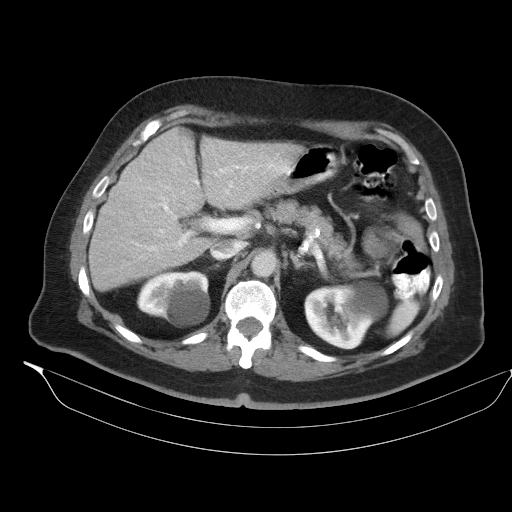
[im 73/92  lung]
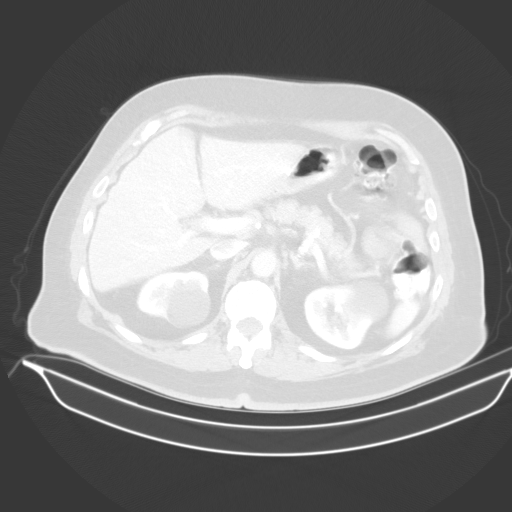
[im 79/92  soft-tissue]
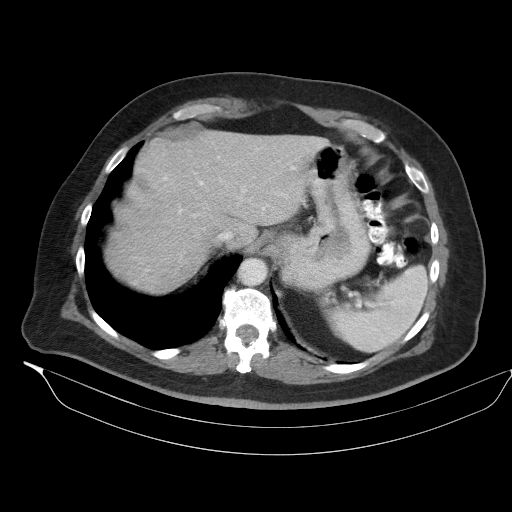
[im 79/92  lung]
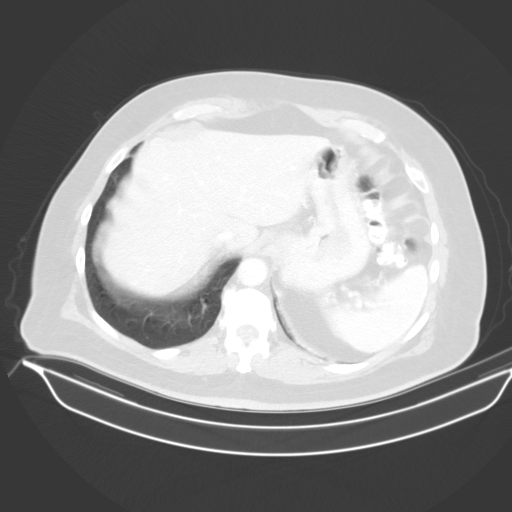
[im 85/92  soft-tissue]
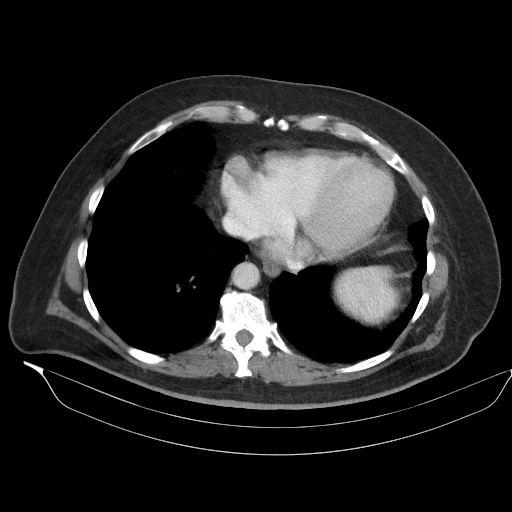
[im 85/92  lung]
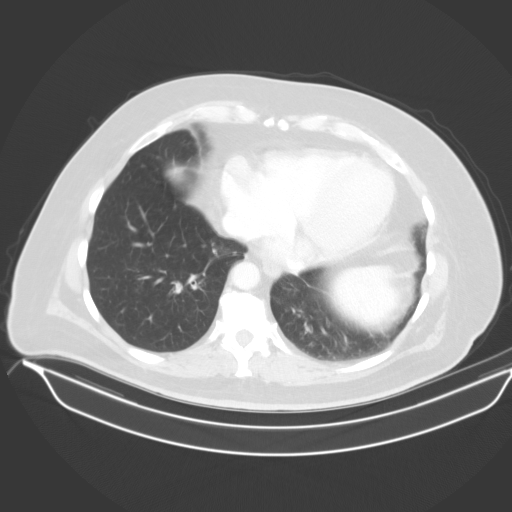

[13 of 32 positions shown; findings below may reference images not displayed]

FINDINGS: Lower chest: No active process.

Hepatobiliary: Liver parenchyma is normal. No calcified gallstones.
No sign of cholecystitis or obstruction by CT.

Pancreas: Normal

Spleen: Normal

Adrenals/Urinary Tract: Adrenal glands are normal. There are
bilateral simple renal cysts. No evidence of mass, stone or
hydronephrosis. No stone in the bladder.

Stomach/Bowel: Sigmoid diverticulosis without evidence of
diverticulitis. No sign of bowel obstruction. No inflammatory
changes. Normal appearing appendix.

Vascular/Lymphatic: Mild aortic and iliac atherosclerosis. No
aneurysm. IVC is normal. No retroperitoneal adenopathy.

Reproductive: Normal

Other: No free fluid or air.

Musculoskeletal: Chronic degenerative changes L5-S1.
IMPRESSION: No acute finding to explain the clinical presentation.

Simple appearing renal cysts. Colon diverticulosis without evidence
of diverticulitis.
